# Patient Record
Sex: Female | Born: 1990 | Race: Asian | Hispanic: No | Marital: Married | Smoking: Never smoker
Health system: Southern US, Community
[De-identification: ages and names within clinical notes are randomized; demographics above are authoritative.]

## PROBLEM LIST (undated history)

## (undated) ENCOUNTER — Inpatient Hospital Stay (HOSPITAL_COMMUNITY): Payer: Self-pay

## (undated) DIAGNOSIS — A159 Respiratory tuberculosis unspecified: Secondary | ICD-10-CM

## (undated) HISTORY — DX: Respiratory tuberculosis unspecified: A15.9

---

## 2014-10-08 DIAGNOSIS — A159 Respiratory tuberculosis unspecified: Secondary | ICD-10-CM

## 2014-10-08 HISTORY — DX: Respiratory tuberculosis unspecified: A15.9

## 2016-11-13 ENCOUNTER — Other Ambulatory Visit: Payer: Self-pay | Admitting: Infectious Disease

## 2016-11-13 ENCOUNTER — Ambulatory Visit
Admission: RE | Admit: 2016-11-13 | Discharge: 2016-11-13 | Disposition: A | Discharge status: Home / Self Care | Payer: No Typology Code available for payment source | Source: Ambulatory Visit | Attending: Infectious Disease | Admitting: Infectious Disease

## 2016-11-13 DIAGNOSIS — R7611 Nonspecific reaction to tuberculin skin test without active tuberculosis: Secondary | ICD-10-CM

## 2018-01-15 ENCOUNTER — Ambulatory Visit (INDEPENDENT_AMBULATORY_CARE_PROVIDER_SITE_OTHER): Payer: Medicaid Other | Admitting: General Practice

## 2018-01-15 ENCOUNTER — Encounter: Payer: Self-pay | Admitting: General Practice

## 2018-01-15 DIAGNOSIS — Z3201 Encounter for pregnancy test, result positive: Secondary | ICD-10-CM | POA: Diagnosis present

## 2018-01-15 DIAGNOSIS — Z3492 Encounter for supervision of normal pregnancy, unspecified, second trimester: Secondary | ICD-10-CM

## 2018-01-15 LAB — POCT PREGNANCY, URINE: Preg Test, Ur: POSITIVE — AB

## 2018-01-15 NOTE — Progress Notes (Signed)
I have reviewed this chart and agree with the RN/CMA assessment and management.    Chivas Notz C Raynah Gomes, MD, FACOG Attending Physician, Faculty Practice [facility]  

## 2018-01-15 NOTE — Progress Notes (Signed)
Patient here for UPT today. UPT +. Patient reports first positive home test 2 months ago. LMP 09/09/17 EDD 06/16/18 7482w2d today. Patient denies taking any medications only PNV. Patient denies any history of health problems in pregnancy or outside of pregnancy. Patient was treated for TB in 2016. Patient desires to start care here. Scheduled ultrasound with MFM for 4/23 @ 8am. Will do new OB labs today. Patient had no questions. [employer] interpreter used for encounter.

## 2018-01-22 LAB — OBSTETRIC PANEL, INCLUDING HIV
Antibody Screen: NEGATIVE
BASOS ABS: 0 10*3/uL (ref 0.0–0.2)
Basos: 0 %
EOS (ABSOLUTE): 0.4 10*3/uL (ref 0.0–0.4)
Eos: 4 %
HEMOGLOBIN: 12 g/dL (ref 11.1–15.9)
HEP B S AG: NEGATIVE
HIV SCREEN 4TH GENERATION: NONREACTIVE
Hematocrit: 37.4 % (ref 34.0–46.6)
IMMATURE GRANS (ABS): 0.1 10*3/uL (ref 0.0–0.1)
Immature Granulocytes: 1 %
LYMPHS: 22 %
Lymphocytes Absolute: 2.6 10*3/uL (ref 0.7–3.1)
MCH: 24.9 pg — AB (ref 26.6–33.0)
MCHC: 32.1 g/dL (ref 31.5–35.7)
MCV: 78 fL — AB (ref 79–97)
MONOCYTES: 5 %
MONOS ABS: 0.6 10*3/uL (ref 0.1–0.9)
Neutrophils Absolute: 8.1 10*3/uL — ABNORMAL HIGH (ref 1.4–7.0)
Neutrophils: 68 %
Platelets: 234 10*3/uL (ref 150–379)
RBC: 4.82 x10E6/uL (ref 3.77–5.28)
RDW: 16.8 % — ABNORMAL HIGH (ref 12.3–15.4)
RPR Ser Ql: NONREACTIVE
RUBELLA: 31.9 {index} (ref >0.99)
Rh Factor: POSITIVE
WBC: 11.8 10*3/uL — ABNORMAL HIGH (ref 3.4–10.8)

## 2018-01-22 LAB — HEMOGLOBINOPATHY EVALUATION
Ferritin: 139 ng/mL (ref 15–150)
HGB A: 73.1 % — AB (ref 96.4–98.8)
HGB F QUANT: 0 % (ref 0.0–2.0)
HGB S: 0 %
HGB SOLUBILITY: NEGATIVE
Hgb A2 Quant: 3.5 % — ABNORMAL HIGH (ref 1.8–3.2)
Hgb C: 0 %
Hgb Variant: 23.4 % — ABNORMAL HIGH

## 2018-01-22 LAB — SMN1 COPY NUMBER ANALYSIS (SMA CARRIER SCREENING)

## 2018-01-22 LAB — CYSTIC FIBROSIS GENE TEST

## 2018-01-22 LAB — ALPHA-THALASSEMIA

## 2018-01-28 ENCOUNTER — Other Ambulatory Visit: Payer: Self-pay | Admitting: Obstetrics & Gynecology

## 2018-01-28 ENCOUNTER — Ambulatory Visit (HOSPITAL_COMMUNITY)
Admission: RE | Admit: 2018-01-28 | Discharge: 2018-01-28 | Disposition: A | Discharge status: Home / Self Care | Payer: Medicaid Other | Source: Ambulatory Visit | Attending: Obstetrics & Gynecology | Admitting: Obstetrics & Gynecology

## 2018-01-28 DIAGNOSIS — Z369 Encounter for antenatal screening, unspecified: Secondary | ICD-10-CM

## 2018-01-28 DIAGNOSIS — O0932 Supervision of pregnancy with insufficient antenatal care, second trimester: Secondary | ICD-10-CM | POA: Diagnosis not present

## 2018-01-28 DIAGNOSIS — Z3492 Encounter for supervision of normal pregnancy, unspecified, second trimester: Secondary | ICD-10-CM | POA: Diagnosis present

## 2018-01-28 DIAGNOSIS — Z3A20 20 weeks gestation of pregnancy: Secondary | ICD-10-CM | POA: Insufficient documentation

## 2018-01-29 ENCOUNTER — Encounter: Payer: Self-pay | Admitting: Obstetrics & Gynecology

## 2018-01-29 DIAGNOSIS — D563 Thalassemia minor: Secondary | ICD-10-CM | POA: Insufficient documentation

## 2018-02-05 ENCOUNTER — Other Ambulatory Visit (HOSPITAL_COMMUNITY)
Admission: RE | Admit: 2018-02-05 | Discharge: 2018-02-05 | Disposition: A | Discharge status: Home / Self Care | Payer: Medicaid Other | Source: Ambulatory Visit | Attending: Advanced Practice Midwife | Admitting: Advanced Practice Midwife

## 2018-02-05 ENCOUNTER — Other Ambulatory Visit: Payer: Self-pay

## 2018-02-05 ENCOUNTER — Encounter: Payer: Self-pay | Admitting: Advanced Practice Midwife

## 2018-02-05 ENCOUNTER — Ambulatory Visit (INDEPENDENT_AMBULATORY_CARE_PROVIDER_SITE_OTHER): Payer: Medicaid Other | Admitting: Advanced Practice Midwife

## 2018-02-05 ENCOUNTER — Encounter: Payer: Self-pay | Admitting: *Deleted

## 2018-02-05 DIAGNOSIS — Z3481 Encounter for supervision of other normal pregnancy, first trimester: Secondary | ICD-10-CM

## 2018-02-05 DIAGNOSIS — Z789 Other specified health status: Secondary | ICD-10-CM | POA: Insufficient documentation

## 2018-02-05 DIAGNOSIS — Z8611 Personal history of tuberculosis: Secondary | ICD-10-CM

## 2018-02-05 DIAGNOSIS — Z603 Acculturation difficulty: Secondary | ICD-10-CM

## 2018-02-05 DIAGNOSIS — Z3482 Encounter for supervision of other normal pregnancy, second trimester: Secondary | ICD-10-CM | POA: Diagnosis present

## 2018-02-05 DIAGNOSIS — Z3480 Encounter for supervision of other normal pregnancy, unspecified trimester: Secondary | ICD-10-CM | POA: Insufficient documentation

## 2018-02-05 DIAGNOSIS — Z98891 History of uterine scar from previous surgery: Secondary | ICD-10-CM

## 2018-02-05 LAB — POCT URINALYSIS DIP (DEVICE)
Bilirubin Urine: NEGATIVE
GLUCOSE, UA: NEGATIVE mg/dL
Hgb urine dipstick: NEGATIVE
Ketones, ur: NEGATIVE mg/dL
LEUKOCYTES UA: NEGATIVE
NITRITE: NEGATIVE
Protein, ur: NEGATIVE mg/dL
Specific Gravity, Urine: 1.015 (ref 1.005–1.030)
UROBILINOGEN UA: 0.2 mg/dL (ref 0.0–1.0)
pH: 7.5 (ref 5.0–8.0)

## 2018-02-05 NOTE — Progress Notes (Signed)
Subjective:   Jillian Browning is a 27 y.o. G1P0 at [redacted]w[redacted]d by LMP being seen today for her first obstetrical visit.  Her obstetrical history is significant for none . Prior c-section x 1 with first baby. She reports that the "baby did not go down".  Patient does not intend to breast feed. Pregnancy history fully reviewed.  Patient reports no complaints.  HISTORY: OB History  Gravida Para Term Preterm AB Living  0 0 1  SAB TAB Ectopic Multiple Live Births  0 0 0 0 1    # Outcome Date GA Lbr Len/2nd Weight Sex Delivery Anes PTL Lv  2 Current           1 Term 05/16/16 110w5d   F CS-Unspec   LIV     Complications: Failure to Progress in First Stage    Last pap smear was done Never and was NA  Past Medical History:  Diagnosis Date  . Tuberculosis 2016   Past Surgical History:  Procedure Laterality Date  . CESAREAN SECTION     History reviewed. No pertinent family history. Social History   Tobacco Use  . Smoking status: Never Smoker  . Smokeless tobacco: Never Used  Substance Use Topics  . Alcohol use: Never    Frequency: Never  . Drug use: Never   No Known Allergies Current Outpatient Medications on File Prior to Visit  Medication Sig Dispense Refill  . Prenatal Vit-Fe Fumarate-FA (PRENATAL MULTIVITAMIN) TABS tablet Take 1 tablet by mouth daily at 12 noon.     No current facility-administered medications on file prior to visit.     Review of Systems Pertinent items noted in HPI and remainder of comprehensive ROS otherwise negative.  Exam   Vitals:   02/05/18 1402  BP: 106/70  Pulse: 92  Weight: 137 lb 14.4 oz (62.6 kg)   Fetal Heart Rate (bpm): 160  Uterus:     Pelvic Exam: Perineum: no hemorrhoids, normal perineum   Vulva: normal external genitalia, no lesions   Vagina:  normal mucosa, normal discharge   Cervix: no lesions and normal, pap smear done.    Adnexa: normal adnexa and no mass, fullness, tenderness   Bony Pelvis: average  System: General:  well-developed, well-nourished female in no acute distress   Breast:  normal appearance, no masses or tenderness   Skin: normal coloration and turgor, no rashes   Neurologic: oriented, normal, negative, normal mood   Extremities: normal strength, tone, and muscle mass, ROM of all joints is normal   HEENT PERRLA, extraocular movement intact and sclera clear, anicteric   Mouth/Teeth mucous membranes moist, pharynx normal without lesions and dental hygiene good   Neck supple and no masses   Cardiovascular: regular rate and rhythm   Respiratory:  no respiratory distress, normal breath sounds   Abdomen: soft, non-tender; bowel sounds normal; no masses,  no organomegaly    Assessment:   Pregnancy: G1P0 Patient Active Problem List   Diagnosis Date Noted  . Supervision of other normal pregnancy, antepartum 02/05/2018  . Language barrier affecting health care 02/05/2018  . History of tuberculosis 02/05/2018  . Previous cesarean section 02/05/2018  . Alpha thalassemia silent carrier 01/29/2018     Plan:  1. Supervision of other normal pregnancy, antepartum 2. Language barrier affecting health care 3. History of tuberculosis 4. Prior c-section X 1 - Interested in TOLAC  - live interpretor used   Initial labs results reviewed. Continue prenatal vitamins. Genetic Screening discussed, AFP and  NIPS: requested. Ultrasound discussed; fetal anatomic survey: results reviewed. Problem list reviewed and updated. The nature of [facility] with multiple MDs and other Advanced Practice Providers was explained to patient; also emphasized that residents, students are part of our team. Routine obstetric precautions reviewed. Return in about 1 month (around 03/05/2018).

## 2018-02-05 NOTE — Patient Instructions (Signed)
AREA PEDIATRIC/FAMILY PRACTICE PHYSICIANS  [facility] [address], [address] [place], [place]  [address] Phone - [phone]   Fax - [phone]  [facility] [address] [place], [place] [address] Phone - [phone]   Fax - [phone]  [facility] [address] [place], [place]  [address] Phone - [phone]   Fax - [phone]  [facility] [address] [place], [place]  [address] Phone - [phone]   Fax - [phone]  [facility] [address] [place], [place]  [address] Phone - [phone]   Fax - [phone]  [facility] [address] [place], [place]  [address] Phone - [phone]   Fax - [phone]  [facility] [address] [place], [place]  [address] Phone - [phone]   Fax - [phone]  [facility] [address] [place], [place]  [address] Phone - [phone]   Fax - [phone]  [facility] [address] [place], [place]  [address] Phone - [phone]   Fax - [phone] [facility] [address] [place], [place]  [address] Phone - [phone]   Fax - [phone]  [facility] [address] [place], [place]  [address] Phone - [phone]   Fax - [phone]  [facility] [address] [place], [place]  [address] Phone - [phone]   Fax - [phone]  [facility] [address] [place], [place]  [address] Phone - [phone]   Fax - [phone]  [facility] GOSR[facility] [address] [place], [place]  [address] Phone - [phone]  [facility] [address] [place], [place]  [address] Phone - [phone]   Fax - [phone]  [facility] [address] [place], [place]  [address] Phone - [phone]   Fax - [phone]  [facility] [address] [place], [place]  [address] Phone - [phone]   Fax - [phone]  [facility] [address] [place], [place]  [address] Phone - [phone]   Fax - [phone]   [facility] [address] [place], [place]  [address] Phone - [phone]   Fax - [phone]  [facility] [address] [place], [place]  [address] Phone - [phone]   Fax - [phone]  [facility] RUB[facility] [address] [place], [place]  [address] Phone - [phone]   Fax - [phone]  [facility] [address] , [place]  [address] Phone - [phone]   Fax - [phone]  [facility] [address] , [place]  [address] Phone - [phone]   Fax - [phone] [facility] [address] [place], [place]  [address] Phone - [phone]   Fax - [phone]  [facility] [address] [place], [place]  [address] Phone - [phone]   Fax - [phone]   [facility] [address] [place], [place]  [address] Phone - [phone]   Fax - [phone]  [facility] [place] Charlene Flemming MD [address] [place]  [address] Phone [phone]  Fax [phone]   

## 2018-02-06 LAB — CYTOLOGY - PAP
CHLAMYDIA, DNA PROBE: NEGATIVE
Diagnosis: NEGATIVE
NEISSERIA GONORRHEA: NEGATIVE

## 2018-02-08 LAB — CULTURE, OB URINE

## 2018-02-08 LAB — URINE CULTURE, OB REFLEX: ORGANISM ID, BACTERIA: NO GROWTH

## 2018-02-13 LAB — AFP, SERUM, OPEN SPINA BIFIDA
AFP MOM: 0.64
AFP Value: 44 ng/mL
Gest. Age on Collection Date: 21.2 weeks
MATERNAL AGE AT EDD: 24.7 a
OSBR RISK 1 IN: 10000
TEST RESULTS AFP: NEGATIVE
Weight: 137 [lb_av]

## 2018-02-14 ENCOUNTER — Encounter: Payer: Self-pay | Admitting: *Deleted

## 2018-03-11 ENCOUNTER — Ambulatory Visit (INDEPENDENT_AMBULATORY_CARE_PROVIDER_SITE_OTHER): Payer: Medicaid Other | Admitting: Obstetrics and Gynecology

## 2018-03-11 VITALS — BP 121/75 | HR 104 | Wt 136.4 lb

## 2018-03-11 DIAGNOSIS — Z789 Other specified health status: Secondary | ICD-10-CM

## 2018-03-11 DIAGNOSIS — Z3480 Encounter for supervision of other normal pregnancy, unspecified trimester: Secondary | ICD-10-CM

## 2018-03-11 DIAGNOSIS — Z98891 History of uterine scar from previous surgery: Secondary | ICD-10-CM

## 2018-03-11 NOTE — Progress Notes (Signed)
Subjective:  Jillian Browning is a 27 y.o. G2P1001 at 658w1d being seen today for ongoing prenatal care.  She is currently monitored for the following issues for this low-risk pregnancy and has Alpha thalassemia silent carrier; Supervision of other normal pregnancy, antepartum; Language barrier affecting health care; History of tuberculosis; and Previous cesarean section on their problem list.  Patient reports no complaints.  Contractions: Not present. Vag. Bleeding: None.  Movement: Present. Denies leaking of fluid.   The following portions of the patient's history were reviewed and updated as appropriate: allergies, current medications, past family history, past medical history, past social history, past surgical history and problem list. Problem list updated.  Objective:   Vitals:   03/11/18 1535  BP: 121/75  Pulse: (!) 104  Weight: 136 lb 6.4 oz (61.9 kg)    Fetal Status: Fetal Heart Rate (bpm): 158 Fundal Height: 25 cm Movement: Present     General:  Alert, oriented and cooperative. Patient is in no acute distress.  Skin: Skin is warm and dry. No rash noted.   Cardiovascular: Normal heart rate noted  Respiratory: Normal respiratory effort, no problems with respiration noted  Abdomen: Soft, gravid, appropriate for gestational age. Pain/Pressure: Present     Pelvic: Vag. Bleeding: None     Cervical exam deferred        Extremities: Normal range of motion.  Edema: None  Mental Status: Normal mood and affect. Normal behavior. Normal judgment and thought content.   Urinalysis:      Assessment and Plan:  Pregnancy: G2P1001 at 598w1d  1. Supervision of other normal pregnancy, antepartum Doing well. Routine care. Plan for 28wk labs at next visit.   2. Previous cesarean section Unsure about route of delivery at this time. Discussed risk with both methods. Follow-up at next visit.   3. Language barrier affecting health care In-person interpretor used.   Preterm labor symptoms and general  obstetric precautions including but not limited to vaginal bleeding, contractions, leaking of fluid and fetal movement were reviewed in detail with the patient. Please refer to After Visit Summary for other counseling recommendations.  Return in about 3 weeks (around 04/01/2018) for ob visit.   Pincus LargePhelps, Ayahna Solazzo Y, DO

## 2018-03-11 NOTE — Patient Instructions (Signed)
Vaginal Birth After Cesarean Delivery Vaginal birth after cesarean delivery (VBAC) is giving birth vaginally after previously delivering a baby by a cesarean. In the past, if a woman had a cesarean delivery, all births afterward would be done by cesarean delivery. This is no longer true. It can be safe for the mother to try a vaginal delivery after having a cesarean delivery. It is important to discuss VBAC with your health care provider early in the pregnancy so you can understand the risks, benefits, and options. It will give you time to decide what is best in your particular case. The final decision about whether to have a VBAC or repeat cesarean delivery should be between you and your health care provider. Any changes in your health or your baby's health during your pregnancy may make it necessary to change your initial decision about VBAC. Women who plan to have a VBAC should check with their health care provider to be sure that:  The previous cesarean delivery was done with a low transverse uterine cut (incision) (not a vertical classical incision).  The birth canal is big enough for the baby.  There were no other operations on the uterus.  An electronic fetal monitor (EFM) will be on at all times during labor.  An operating room will be available and ready in case an emergency cesarean delivery is needed.  A health care provider and surgical nursing staff will be available at all times during labor to be ready to do an emergency delivery cesarean if necessary.  An anesthesiologist will be present in case an emergency cesarean delivery is needed.  The nursery is prepared and has adequate personnel and necessary equipment available to care for the baby in case of an emergency cesarean delivery. Benefits of VBAC  Shorter stay in the hospital.  Avoidance of risks associated with cesarean delivery, such as: ? Surgical complications, such as opening of the incision or hernia in the  incision. ? Injury to other organs. ? Fever. This can occur if an infection develops after surgery. It can also occur as a reaction to the medicine given to make you numb during the surgery.  Less blood loss and need for blood transfusions.  Lower risk of blood clots and infection.  Shorter recovery.  Decreased risk for having to remove the uterus (hysterectomy).  Decreased risk for the placenta to completely or partially cover the opening of the uterus (placenta previa) with a future pregnancy.  Decrease risk in future labor and delivery. Risks of a VBAC  Tearing (rupture) of the uterus. This is occurs in less than 1% of VBACs. The risk of this happening is higher if: ? Steps are taken to begin the labor process (induce labor) or stimulate or strengthen contractions (augment labor). ? Medicine is used to soften (ripen) the cervix.  Having to remove the uterus (hysterectomy) if it ruptures. VBAC should not be done if:  The previous cesarean delivery was done with a vertical (classical) or T-shaped incision or you do not know what kind of incision was made.  You had a ruptured uterus.  You have had certain types of surgery on your uterus, such as removal of uterine fibroids. Ask your health care provider about other types of surgeries that prevent you from having a VBAC.  You have certain medical or childbirth (obstetrical) problems.  There are problems with the baby.  You have had two previous cesarean deliveries and no vaginal deliveries. Other facts to know about VBAC:  It   is safe to have an epidural anesthetic with VBAC.  It is safe to turn the baby from a breech position (attempt an external cephalic version).  It is safe to try a VBAC with twins.  VBAC may not be successful if your baby weights 8.8 lb (4 kg) or more. However, weight predictions are not always accurate and should not be used alone to decide if VBAC is right for you.  There is an increased failure rate  if the time between the cesarean delivery and VBAC is less than 19 months.  Your health care provider may advise against a VBAC if you have preeclampsia (high blood pressure, protein in the urine, and swelling of face and extremities).  VBAC is often successful if you previously gave birth vaginally.  VBAC is often successful when the labor starts spontaneously before the due date.  Delivering a baby through a VBAC is similar to having a normal spontaneous vaginal delivery. This information is not intended to replace advice given to you by your health care provider. Make sure you discuss any questions you have with your health care provider. Document Released: 03/17/2007 Document Revised: 03/01/2016 Document Reviewed: 04/23/2013 Elsevier Interactive Patient Education  2018 Elsevier Inc.  

## 2018-03-11 NOTE — Progress Notes (Signed)
Interpreter Wier Siu 

## 2018-03-12 ENCOUNTER — Encounter: Payer: Self-pay | Admitting: Family Medicine

## 2018-03-12 DIAGNOSIS — O0930 Supervision of pregnancy with insufficient antenatal care, unspecified trimester: Secondary | ICD-10-CM | POA: Insufficient documentation

## 2018-03-28 ENCOUNTER — Other Ambulatory Visit: Payer: Self-pay | Admitting: *Deleted

## 2018-03-28 DIAGNOSIS — Z3480 Encounter for supervision of other normal pregnancy, unspecified trimester: Secondary | ICD-10-CM

## 2018-03-28 DIAGNOSIS — Z98891 History of uterine scar from previous surgery: Secondary | ICD-10-CM

## 2018-04-01 ENCOUNTER — Ambulatory Visit (INDEPENDENT_AMBULATORY_CARE_PROVIDER_SITE_OTHER): Payer: Medicaid Other | Admitting: Obstetrics and Gynecology

## 2018-04-01 ENCOUNTER — Other Ambulatory Visit: Payer: Medicaid Other

## 2018-04-01 VITALS — BP 115/68 | HR 85 | Ht 63.0 in | Wt 157.9 lb

## 2018-04-01 DIAGNOSIS — Z98891 History of uterine scar from previous surgery: Secondary | ICD-10-CM | POA: Diagnosis not present

## 2018-04-01 DIAGNOSIS — Z789 Other specified health status: Secondary | ICD-10-CM

## 2018-04-01 DIAGNOSIS — Z3480 Encounter for supervision of other normal pregnancy, unspecified trimester: Secondary | ICD-10-CM

## 2018-04-01 DIAGNOSIS — Z23 Encounter for immunization: Secondary | ICD-10-CM | POA: Diagnosis not present

## 2018-04-01 NOTE — Progress Notes (Signed)
   PRENATAL VISIT NOTE  Subjective:  Jillian Browning is a 27 y.o. G2P1001 at 2121w1d being seen today for ongoing prenatal care.  She is currently monitored for the following issues for this low-risk pregnancy and has Alpha thalassemia silent carrier; Supervision of other normal pregnancy, antepartum; Language barrier affecting health care; History of tuberculosis; Previous cesarean section; and Late prenatal care affecting pregnancy on their problem list.  Patient reports nausea and vomiting, but does not request any meds at this time.  Contractions: Not present. Vag. Bleeding: None.  Movement: Present. Denies leaking of fluid.   The following portions of the patient's history were reviewed and updated as appropriate: allergies, current medications, past family history, past medical history, past social history, past surgical history and problem list. Problem list updated.  Objective:   Vitals:   04/01/18 0846 04/01/18 0851  BP: 115/68   Pulse: 85   Weight: 157 lb 14.4 oz (71.6 kg)   Height:  5\' 3"  (1.6 m)    Fetal Status: Fetal Heart Rate (bpm): 152 Fundal Height: 30 cm Movement: Present     General:  Alert, oriented and cooperative. Patient is in no acute distress.  Skin: Skin is warm and dry. No rash noted.   Cardiovascular: Normal heart rate noted  Respiratory: Normal respiratory effort, no problems with respiration noted  Abdomen: Soft, gravid, appropriate for gestational age.  Pain/Pressure: Present     Pelvic: Cervical exam deferred        Extremities: Normal range of motion.  Edema: None  Mental Status: Normal mood and affect. Normal behavior. Normal judgment and thought content.   Assessment and Plan:  Pregnancy: G2P1001 at 6121w1d  1. Supervision of other normal pregnancy, antepartum - Third trimester pregnancy instructions given - Anticipatory guidance for OB visits every 2 wks until 36 wks then weekly - Discussed contraception options with patient and spouse; declines any  contraception after delivery at this time - TdaP given today  2. Previous cesarean section - Desire TOLAC (previous was for suspected failure to descend) - R/B reviewed with patient - Consent not signed at this visit - will need to be signed at next visit  3. Language barrier affecting health care - [employer] video interpreter Tanya # [phone]  Preterm labor symptoms and general obstetric precautions including but not limited to vaginal bleeding, contractions, leaking of fluid and fetal movement were reviewed in detail with the patient. Please refer to After Visit Summary for other counseling recommendations.  Return in about 2 weeks (around 04/15/2018) for Return OB visit.  Future Appointments  Date Time Provider Department Center  04/01/2018  9:30 AM [facility]    Rough [place] ReadyRolitta Pretty [place], [place]

## 2018-04-01 NOTE — Progress Notes (Signed)
   PRENATAL VISIT NOTE  Subjective:  Jillian Browning is a 27 y.o. G2P1001 at 6462w1d being seen today for ongoing prenatal care.  She is currently monitored for the following issues for this low-risk pregnancy and has Alpha thalassemia silent carrier; Supervision of other normal pregnancy, antepartum; Language barrier affecting health care; History of tuberculosis; Previous cesarean section; and Late prenatal care affecting pregnancy on their problem list.  Patient reports no complaints with occasional N&V. She states that she does not need any intervention for sx.  Contractions: Not present. Vag. Bleeding: None.  Movement: Present. Denies leaking of fluid.   The following portions of the patient's history were reviewed and updated as appropriate: allergies, current medications, past family history, past medical history, past social history, past surgical history and problem list. Problem list updated.  Objective:   Vitals:   04/01/18 0846 04/01/18 0851  BP: 115/68   Pulse: 85   Weight: 157 lb 14.4 oz (71.6 kg)   Height:  5\' 3"  (1.6 m)    Fetal Status: Fetal Heart Rate (bpm): 152 Fundal Height: 30 cm Movement: Present     General:  Alert, oriented and cooperative. Patient is in no acute distress.  Skin: Skin is warm and dry. No rash noted.   Cardiovascular: Normal heart rate noted  Respiratory: Normal respiratory effort, no problems with respiration noted  Abdomen: Soft, gravid, appropriate for gestational age.  Pain/Pressure: Present     Pelvic: Cervical exam deferred        Extremities: Normal range of motion.  Edema: None  Mental Status: Normal mood and affect. Normal behavior. Normal judgment and thought content.   Assessment and Plan:  Pregnancy: G2P1001 at 3062w1d  1. Supervision of other normal pregnancy, antepartum Pt. counseled on S&S of pre term labor Pt. Was informed of various methods of birth control. Pt. Has verbalized no interests in post-partum contraception.  2. Previous  cesarean section Pt. Has verbalized interest in TOLAC. Pt. still has to sign consent  3. Language barrier affecting health care Kenney Housemananya [phone]; interpreter  Preterm labor symptoms and general obstetric precautions including but not limited to vaginal bleeding, contractions, leaking of fluid and fetal movement were reviewed in detail with the patient. Please refer to After Visit Summary for other counseling recommendations.  Return in about 2 weeks (around 04/15/2018) for Return OB visit.  No future appointments.  Fahim Kats Winona LegatoM Dempsey Knotek, RN, FNP (student)

## 2018-04-01 NOTE — Progress Notes (Incomplete)
   PRENATAL VISIT NOTE  Subjective:  Jillian Browning is a 27 y.o. G2P1001 at 172w1d being seen today for ongoing prenatal care.  She is currently monitored for the following issues for this {Blank single:19197::"high-risk","low-risk"} pregnancy and has Alpha thalassemia silent carrier; Supervision of other normal pregnancy, antepartum; Language barrier affecting health care; History of tuberculosis; Previous cesarean section; and Late prenatal care affecting pregnancy on their problem list.  Patient reports {sx:14538}.  Contractions: Not present. Vag. Bleeding: None.  Movement: Present. Denies leaking of fluid.   The following portions of the patient's history were reviewed and updated as appropriate: allergies, current medications, past family history, past medical history, past social history, past surgical history and problem list. Problem list updated.  Objective:   Vitals:   04/01/18 0846 04/01/18 0851  BP: 115/68   Pulse: 85   Weight: 157 lb 14.4 oz (71.6 kg)   Height:  5\' 3"  (1.6 m)    Fetal Status: Fetal Heart Rate (bpm): 152 Fundal Height: 30 cm Movement: Present     General:  Alert, oriented and cooperative. Patient is in no acute distress.  Skin: Skin is warm and dry. No rash noted.   Cardiovascular: Normal heart rate noted  Respiratory: Normal respiratory effort, no problems with respiration noted  Abdomen: Soft, gravid, appropriate for gestational age.  Pain/Pressure: Present     Pelvic: {Blank single:19197::"Cervical exam performed","Cervical exam deferred"}        Extremities: Normal range of motion.  Edema: None  Mental Status: Normal mood and affect. Normal behavior. Normal judgment and thought content.   Assessment and Plan:  Pregnancy: G2P1001 at 7172w1d  1. Supervision of other normal pregnancy, antepartum ***  2. Previous cesarean section ***  3. Language barrier affecting health care ***  {Blank single:19197::"Term","Preterm"} labor symptoms and general obstetric  precautions including but not limited to vaginal bleeding, contractions, leaking of fluid and fetal movement were reviewed in detail with the patient. Please refer to After Visit Summary for other counseling recommendations.  Return in about 2 weeks (around 04/15/2018) for Return OB visit.  Future Appointments  Date Time Provider Department Center  04/01/2018  9:30 AM [facility]    [place]erry[place]itta Alanii Ramer, [place]

## 2018-04-01 NOTE — Patient Instructions (Signed)
Ba thng th? th? ba c?a thai k? (Third Trimester of Pregnancy) Ba thng th? ba l t? tu?n 29 ??n tu?n 42, thng th? 7 ??n thng th? 9. Ba thng th? ba c?ng l th?i gian khi bo thai ?ang pht tri?n nhanh chng. Vo cu?i thng th? chn, bo thai di kho?ng 20 inch v n?ng kho?ng 6-10 pao. C? TH? THAY ??I C? th? c?a qu v? tr?i qua r?t nhi?u thay ??i trong th?i gian mang thai. Nh?ng thay ??i khc nhau ? cc ph? n? khc nhau.  Cn n?ng c?a qu v? s? ti?p t?c t?ng. Qu v? c th? t?ng ???c 25-35 pao (11 - 16 kg) vo cu?i thai k?.  Qu v? c th? b?t ??u c cc v?t n?t trn hng, b?ng v ng?c.  Qu v? c th? ?i ti?u th??ng xuyn h?n v bo thai di chuy?n xu?ng th?p h?n vo khung ch?u c?a qu v? v ? vo bng quang c?a qu v?.  Qu v? c th? b? ho?c ti?p t?c b? ? nng do vi?c mang thai.  Qu v? c th? b? to bn v m?t s? hoc mn nh?t ??nh ?ang lm cc c? c ch?c n?ng ??y ch?t th?i qua ???ng ru?t ho?t ??ng ch?m l?i.  Qu v? c th? b? b?nh tr? ho?c s?ng, ph?ng t?nh m?ch (gin t?nh m?ch).  Qu v? c th? ?au khung ch?u v t?ng cn v cc hoc mn mang thai lm l?ng kh?p n?i gi?a cc x??ng ? khung ch?u c?a qu v?. ?au l?ng c th? do s? c? g?ng qu s?c c?a c? b?p h? tr? t? th? c?a qu v?.  Qu v? c nh?ng thay ??i v? tc. Nh?ng thay ??i ny c th? bao g?m tc dy ln, tc m?c nhanh h?n v thay ??i v? k?t c?u. M?t s? ph? n? c?ng b? r?ng tc trong khi ho?c sau khi mang thai, ho?c tc c?m th?y kh ho?c m?ng. Tc c?a qu v? s? c nhi?u kh? n?ng s? tr? l?i bnh th??ng sau khi em b ???c sinh ra.  Ng?c qu v? s? ti?p t?c pht tri?n v nh?y c?m ?au. Ch?t d?ch mu vng c th? r? t? v c?a qu v? g?i l s?a non.  R?n c?a qu v? c th? l?i ra.  Qu v? c th? c?m th?y kh th? v t? cung c?a qu v? to ra.  Qu v? c th? nh?n th?y thai nhi "r?i xu?ng", ho?c di chuy?n xu?ng th?p h?n trong b?ng c?a qu v?.  Qu v? c th? b? ti?t d?ch nh?y c mu. ?i?u ny th??ng x?y ra m?t vi ngy ??n m?t tu?n tr??c khi b?t ??u  sinh ??.  C? t? cung c?a qu v? tr? nn m?ng v m?m (b? m?ng c? t? cung) g?n ngy d? sinh c?a qu v?. TRNG ??I ?I?U G TRONG NH?NG L?N KHM TR??C KHI SINH Qu v? s? ???c khm tr??c khi sinh 2 tu?n m?t l?n cho ??n tu?n 36. Sau ?, qu v? s? ???c khm hng tu?n tr??c khi sinh. Trong m?t l?n khm tr??c khi sinh:  Qu v? s? ???c cn n?ng ?? ??m b?o qu v? v thai nhi ?ang pht tri?n bnh th??ng.  Qu v? ???c ?o huy?t p.  Qu v? s? ???c ?o b?ng ?? theo di s? pht tri?n c?a b.  S? nghe th?y nh?p tim thai.  B?t k? k?t qu? ki?m tra no trong l?n khm tr??c ? s? ???c th?o lu?n.  Qu v? c   th? ???c ki?m tra c? t? cung g?n ngy d? sinh ?? xem c? t? cung ? m?ng ch?a. Lc ???c kho?ng 36 tu?n, chuyn gia ch?m sc y t? c?a qu v? s? ki?m tra c? t? cung c?a qu v?. Cng lc ?, chuyn gia ch?m sc y t? c?ng s? th?c hi?n ki?m tra ch?t ti?t ra c?a m m ??o. Ki?m tra ny l ?? xc ??nh xem c m?t lo?i vi khu?n, lin c?u nhm B, hay khng. Chuyn gia ch?m sc y t? c?a qu v? s? gi?i thch thm. Chuyn gia ch?m sc y t? c th? h?i qu v?:  K? ho?ch sinh c?a qu v? l g.  Qu v? c?m th?y th? no.  Qu v? c c?m th?y em b c? ??ng.  Li?u qu v? c b?t k? tri?u ch?ng b?t th??ng no, ch?ng h?n nh? r r? ch?t l?ng, ch?y mu, ?au ??u nghim tr?ng ho?c co th?t ? b?ng.  Li?u qu v? c ?ang s? d?ng b?t k? s?n ph?m thu?c l no, bao g?m thu?c l d?ng ht, thu?c l d?ng nhai v thu?c l ?i?n t?.  Li?u qu v? c b?t k? cu h?i no khng. Nh?ng ki?m tra ho?c sng l?c khc c th? ???c th?c hi?n trong k? ba thng th? hai c?a qu v? bao g?m:  Cc xt nghi?m mu ki?m tra n?ng ?? s?t th?p (thi?u mu).  Th? thai ?? ki?m tra s?c kh?e, m?c ?? ho?t ??ng v s? pht tri?n c?a thai nhi. Th? nghi?m ???c th?c hi?n n?u qu v? c m?t s? tnh tr?ng b?nh l, ho?c n?u c nh?ng v?n ?? trong qu trnh mang thai.  Xt nghi?m HIV (Vi rt suy gi?m mi?n d?ch ? ng??i). N?u qu v? c nguy c? cao, qu v? c th? ???c ki?m tra HIV  trong ba thng th? ba c?a k? mang thai. CHUY?N D? GI? Qu v? c th? c?m th?y co th?t nh?, khng ??u m d?n d?n s? h?t. Chng ???c g?i l c?n co th?t Braxton Hicks, hay chuy?n d? gi?. Co th?t c th? ko di trong nhi?u gi?, nhi?u ngy, ho?c th?m ch nhi?u tu?n tr??c khi chuy?n d? th?t. N?u c?n co th?t ??u ??n, t?ng c??ng, ho?c tr? nn ?au ??n, t?t nh?t l ph?i ??n g?p chuyn gia ch?m sc y t? ?? khm. CC D?U HI?U TR? D?  Co th?t gi?ng nh? khi c kinh nguy?t.  Cc c?n co th?t cch nhau 5 pht ho?c nhanh h?n.  Co th?t b?t ??u t? ??nh t? cung v lan xu?ng b?ng d??i v l?ng.  C?m gic p l?c ln khung ch?u t?ng ln ho?c ?au l?ng.  Ti?t d?ch nh?y c n??c ho?c mu t? m ??o. N?u qu v? c b?t k? nh?ng d?u hi?u ny tr??c tu?n 37 c?a thai k?, g?i cho chuyn gia ch?m sc y t? ngay l?p t?c. Qu v? s? c?n ??n b?nh vi?n ?? ???c ki?m tra ngay l?p t?c. H??NG D?N CH?M SC T?I NH  Trnh t?t c? thu?c l, th?o d??c, r??u v ma ty khng ???c k ??n. Cc ha ch?t ny ?nh h??ng ??n s? hnh thnh v pht tri?n c?a em b.  Khng s? d?ng b?t c? cc s?n ph?m thu?c l no, bao g?m thu?c l d?ng ht, thu?c l d?ng nhai v thu?c l ?i?n t?. N?u qu v? c?n gip ?? ?? cai thu?c, hy h?i chuyn gia ch?m sc s?c kh?e. Qu v? c th? nh?n ???c h? tr? t?   v?n v cc ngu?n h? tr? khc ?? gip qu v? b? thu?c l.  Tun th? cc ch? d?n c?a chuyn gia ch?m sc y t? v? vi?c s? d?ng thu?c. C nh?ng lo?i thu?c l an ton ho?c khng an ton trong qu trnh mang thai.  T?p th? d?c th??ng xuyn theo ch? d?n c?a chuyn gia ch?m sc y t?. B? co th?t t? cung l m?t d?u hi?u t?t ?? ng?ng t?p th? d?c.  Ti?p t?c ?n cc b?a ?n th??ng xuyn, lnh m?nh.  M?c m?t chi?c o ng?c nng ?? t?t cho v b? nh?y c?m ?au.  Khng s? d?ng b?n t?m n??c nng, phng xng h?i, ho?c phng t?m h?i.  ?eo dy an ton c?a qu v? m?i lc khi li xe.  Trnh th?t s?ng, ph mai ch?a n?u chn, h?p v? sinh c?a mo, v ??t v? sinh dnh cho mo. Nh?ng th? ny mang  m?m b?nh c th? gy d? t?t b?m sinh cho em b.  U?ng vitamin tr??c khi sinh c?a qu v?.  U?ng 1500-2000 mg canxi m?i ngy b?t ??u t? tu?n th? 20 c?a thai k? cho ??n khi sinh con.  Hy th? dng thu?c lm m?m phn (n?u chuyn gia ch?m sc y t? c?a qu v? ch?p thu?n) n?u qu v? b? to bn. ?n thm th?c ?n giu ch?t x?, nh? rau t??i ho?c tri cy v ng? c?c nguyn h?t. U?ng nhi?u n??c ?? gi? cho n??c ti?u trong ho?c vng nh?t.  T?m b?n ng?i ?? lm d?u c?n ?au ho?c s? kh ch?u do b?nh tr? gy ra. S? d?ng kem tr? n?u chuyn gia ch?m sc y t? ch?p thu?n.  N?u qu v? b? gin t?nh m?ch, hy ?eo t?t h? tr?. Nng cao chn c?a qu v? trong 15 pht, 3-4 l?n m?t ngy. H?n ch? mu?i trong ch? ?? ?n c?a qu v?.  Trnh nng v?t n?ng, hay ?i giy th?p gt, v luy?n t?p t? th? ph h?p.  Ngh? ng?i nhi?u v?i ?i chn c?a qu v? nng cao n?u qu v? b? chu?t rt chn ho?c ?au vng th?t l?ng.  ??n khm nha s? n?u qu v? ch?a ??n trong th?i gian qu v? mang thai. S? d?ng m?t bn ch?i m?m ?? ch?i r?ng c?a qu v? v hy nh? nhng khi dng ch? nha khoa.  C th? ???c ti?p t?c c quan h? tnh d?c tr? khi chuyn gia ch?m sc y t? yu c?u khc.  Khng ?i du l?ch xa tr? khi n l hon ton c?n thi?t v ch? ?i v?i s? ch?p thu?n c?a chuyn gia ch?m sc y t?.  Tham gia cc l?p h?c tr??c khi sinh ?? hi?u, th?c hnh, v ??t cu h?i v? tr? d? v sinh con.  Th?c hi?n m?t l?n th? t?i b?nh vi?n.  Chu?n b? ti ?? ?? ?i vi?n.  Chu?n b? phng cho em b.  Ti?p t?c ??n m?i l?n h?n khm tr??c khi sinh theo ch? d?n c?a chuyn gia ch?m sc y t? c?a qu v?.  ?I KHM N?U:  Qu v? khng ch?c ch?n li?u qu v? c tr? d? ho?c li?u qu v? ? v? ?i hay ch?a.  Qu v? b? hoa m?t.  Qu v? b? co th?t vng ch?u, p l?c ln vng ch?u, ?au m ? ? vng b?ng c?a qu v?.  Qu v? b? bu?n nn, nn m?a ho?c tiu ch?y lin t?c.  Qu v? c d?ch m ??o mi kh ch?u.    Qu v? b? ?au khi ?i ti?u.  NGAY L?P T?C ?I KHM N?U:  Qu v? b? s?t.  Qu  v? b? r r? d?ch t? m ??o.  Qu v? c ra ??m mu ho?c ch?y mu t? m ??o.  Qu v? b? co th?t nghim tr?ng ho?c ?au ? b?ng.  Qu v? gi?m cn ho?c t?ng cn nhanh.  Qu v? b? kh th? cng v?i ?au ng?c.  Qu v? th?y s?ng b?t ng? ho?c r?t to ? m?t, tay, m?t c chn, bn chn ho?c c?ng chn.  Qu v? khng th?y em b c? ??ng trong h?n m?t gi?.  Qu v? b? ?au ??u nghim tr?ng m khng h?t sau khi dng thu?c.  Qu v? b? thay ??i th? l?c.  Thng tin ny khng nh?m m?c ?ch thay th? cho l?i khuyn m chuyn gia ch?m sc s?c kh?e ni v?i qu v?. Hy b?o ??m qu v? ph?i th?o lu?n b?t k? v?n ?? g m qu v? c v?i chuyn gia ch?m sc s?c kh?e c?a qu v?. Document Released: 10/21/2015 Document Revised: 10/21/2015 Document Reviewed: 11/25/2012 Elsevier Interactive Patient Education  2017 Elsevier Inc.  

## 2018-04-02 LAB — CBC
Hematocrit: 36.3 % (ref 34.0–46.6)
Hemoglobin: 12 g/dL (ref 11.1–15.9)
MCH: 26.4 pg — ABNORMAL LOW (ref 26.6–33.0)
MCHC: 33.1 g/dL (ref 31.5–35.7)
MCV: 80 fL (ref 79–97)
Platelets: 196 10*3/uL (ref 150–450)
RBC: 4.54 x10E6/uL (ref 3.77–5.28)
RDW: 15.3 % (ref 12.3–15.4)
WBC: 12.9 10*3/uL — ABNORMAL HIGH (ref 3.4–10.8)

## 2018-04-02 LAB — RPR: RPR Ser Ql: NONREACTIVE

## 2018-04-02 LAB — GLUCOSE TOLERANCE, 2 HOURS W/ 1HR
Glucose, 1 hour: 94 mg/dL (ref 65–179)
Glucose, 2 hour: 104 mg/dL (ref 65–152)
Glucose, Fasting: 86 mg/dL (ref 65–91)

## 2018-04-02 LAB — HIV ANTIBODY (ROUTINE TESTING W REFLEX): HIV SCREEN 4TH GENERATION: NONREACTIVE

## 2018-04-15 ENCOUNTER — Ambulatory Visit (INDEPENDENT_AMBULATORY_CARE_PROVIDER_SITE_OTHER): Payer: Medicaid Other | Admitting: Advanced Practice Midwife

## 2018-04-15 VITALS — BP 104/67 | HR 99 | Wt 159.0 lb

## 2018-04-15 DIAGNOSIS — Z3480 Encounter for supervision of other normal pregnancy, unspecified trimester: Secondary | ICD-10-CM

## 2018-04-15 DIAGNOSIS — Z789 Other specified health status: Secondary | ICD-10-CM

## 2018-04-15 DIAGNOSIS — Z3483 Encounter for supervision of other normal pregnancy, third trimester: Secondary | ICD-10-CM

## 2018-04-15 DIAGNOSIS — Z98891 History of uterine scar from previous surgery: Secondary | ICD-10-CM

## 2018-04-15 NOTE — Patient Instructions (Signed)
Ba thng th? th? ba c?a thai k? (Third Trimester of Pregnancy) Ba thng th? ba l t? tu?n 29 ??n tu?n 42, thng th? 7 ??n thng th? 9. Ba thng th? ba c?ng l th?i gian khi bo thai ?ang pht tri?n nhanh chng. Vo cu?i thng th? chn, bo thai di kho?ng 20 inch v n?ng kho?ng 6-10 pao. C? TH? THAY ??I C? th? c?a qu v? tr?i qua r?t nhi?u thay ??i trong th?i gian mang thai. Nh?ng thay ??i khc nhau ? cc ph? n? khc nhau.  Cn n?ng c?a qu v? s? ti?p t?c t?ng. Qu v? c th? t?ng ???c 25-35 pao (11 - 16 kg) vo cu?i thai k?.  Qu v? c th? b?t ??u c cc v?t n?t trn hng, b?ng v ng?c.  Qu v? c th? ?i ti?u th??ng xuyn h?n v bo thai di chuy?n xu?ng th?p h?n vo khung ch?u c?a qu v? v ? vo bng quang c?a qu v?.  Qu v? c th? b? ho?c ti?p t?c b? ? nng do vi?c mang thai.  Qu v? c th? b? to bn v m?t s? hoc mn nh?t ??nh ?ang lm cc c? c ch?c n?ng ??y ch?t th?i qua ???ng ru?t ho?t ??ng ch?m l?i.  Qu v? c th? b? b?nh tr? ho?c s?ng, ph?ng t?nh m?ch (gin t?nh m?ch).  Qu v? c th? ?au khung ch?u v t?ng cn v cc hoc mn mang thai lm l?ng kh?p n?i gi?a cc x??ng ? khung ch?u c?a qu v?. ?au l?ng c th? do s? c? g?ng qu s?c c?a c? b?p h? tr? t? th? c?a qu v?.  Qu v? c nh?ng thay ??i v? tc. Nh?ng thay ??i ny c th? bao g?m tc dy ln, tc m?c nhanh h?n v thay ??i v? k?t c?u. M?t s? ph? n? c?ng b? r?ng tc trong khi ho?c sau khi mang thai, ho?c tc c?m th?y kh ho?c m?ng. Tc c?a qu v? s? c nhi?u kh? n?ng s? tr? l?i bnh th??ng sau khi em b ???c sinh ra.  Ng?c qu v? s? ti?p t?c pht tri?n v nh?y c?m ?au. Ch?t d?ch mu vng c th? r? t? v c?a qu v? g?i l s?a non.  R?n c?a qu v? c th? l?i ra.  Qu v? c th? c?m th?y kh th? v t? cung c?a qu v? to ra.  Qu v? c th? nh?n th?y thai nhi "r?i xu?ng", ho?c di chuy?n xu?ng th?p h?n trong b?ng c?a qu v?.  Qu v? c th? b? ti?t d?ch nh?y c mu. ?i?u ny th??ng x?y ra m?t vi ngy ??n m?t tu?n tr??c khi b?t ??u  sinh ??.  C? t? cung c?a qu v? tr? nn m?ng v m?m (b? m?ng c? t? cung) g?n ngy d? sinh c?a qu v?. TRNG ??I ?I?U G TRONG NH?NG L?N KHM TR??C KHI SINH Qu v? s? ???c khm tr??c khi sinh 2 tu?n m?t l?n cho ??n tu?n 36. Sau ?, qu v? s? ???c khm hng tu?n tr??c khi sinh. Trong m?t l?n khm tr??c khi sinh:  Qu v? s? ???c cn n?ng ?? ??m b?o qu v? v thai nhi ?ang pht tri?n bnh th??ng.  Qu v? ???c ?o huy?t p.  Qu v? s? ???c ?o b?ng ?? theo di s? pht tri?n c?a b.  S? nghe th?y nh?p tim thai.  B?t k? k?t qu? ki?m tra no trong l?n khm tr??c ? s? ???c th?o lu?n.  Qu v? c   th? ???c ki?m tra c? t? cung g?n ngy d? sinh ?? xem c? t? cung ? m?ng ch?a. Lc ???c kho?ng 36 tu?n, chuyn gia ch?m sc y t? c?a qu v? s? ki?m tra c? t? cung c?a qu v?. Cng lc ?, chuyn gia ch?m sc y t? c?ng s? th?c hi?n ki?m tra ch?t ti?t ra c?a m m ??o. Ki?m tra ny l ?? xc ??nh xem c m?t lo?i vi khu?n, lin c?u nhm B, hay khng. Chuyn gia ch?m sc y t? c?a qu v? s? gi?i thch thm. Chuyn gia ch?m sc y t? c th? h?i qu v?:  K? ho?ch sinh c?a qu v? l g.  Qu v? c?m th?y th? no.  Qu v? c c?m th?y em b c? ??ng.  Li?u qu v? c b?t k? tri?u ch?ng b?t th??ng no, ch?ng h?n nh? r r? ch?t l?ng, ch?y mu, ?au ??u nghim tr?ng ho?c co th?t ? b?ng.  Li?u qu v? c ?ang s? d?ng b?t k? s?n ph?m thu?c l no, bao g?m thu?c l d?ng ht, thu?c l d?ng nhai v thu?c l ?i?n t?.  Li?u qu v? c b?t k? cu h?i no khng. Nh?ng ki?m tra ho?c sng l?c khc c th? ???c th?c hi?n trong k? ba thng th? hai c?a qu v? bao g?m:  Cc xt nghi?m mu ki?m tra n?ng ?? s?t th?p (thi?u mu).  Th? thai ?? ki?m tra s?c kh?e, m?c ?? ho?t ??ng v s? pht tri?n c?a thai nhi. Th? nghi?m ???c th?c hi?n n?u qu v? c m?t s? tnh tr?ng b?nh l, ho?c n?u c nh?ng v?n ?? trong qu trnh mang thai.  Xt nghi?m HIV (Vi rt suy gi?m mi?n d?ch ? ng??i). N?u qu v? c nguy c? cao, qu v? c th? ???c ki?m tra HIV  trong ba thng th? ba c?a k? mang thai. CHUY?N D? GI? Qu v? c th? c?m th?y co th?t nh?, khng ??u m d?n d?n s? h?t. Chng ???c g?i l c?n co th?t Braxton Hicks, hay chuy?n d? gi?. Co th?t c th? ko di trong nhi?u gi?, nhi?u ngy, ho?c th?m ch nhi?u tu?n tr??c khi chuy?n d? th?t. N?u c?n co th?t ??u ??n, t?ng c??ng, ho?c tr? nn ?au ??n, t?t nh?t l ph?i ??n g?p chuyn gia ch?m sc y t? ?? khm. CC D?U HI?U TR? D?  Co th?t gi?ng nh? khi c kinh nguy?t.  Cc c?n co th?t cch nhau 5 pht ho?c nhanh h?n.  Co th?t b?t ??u t? ??nh t? cung v lan xu?ng b?ng d??i v l?ng.  C?m gic p l?c ln khung ch?u t?ng ln ho?c ?au l?ng.  Ti?t d?ch nh?y c n??c ho?c mu t? m ??o. N?u qu v? c b?t k? nh?ng d?u hi?u ny tr??c tu?n 37 c?a thai k?, g?i cho chuyn gia ch?m sc y t? ngay l?p t?c. Qu v? s? c?n ??n b?nh vi?n ?? ???c ki?m tra ngay l?p t?c. H??NG D?N CH?M SC T?I NH  Trnh t?t c? thu?c l, th?o d??c, r??u v ma ty khng ???c k ??n. Cc ha ch?t ny ?nh h??ng ??n s? hnh thnh v pht tri?n c?a em b.  Khng s? d?ng b?t c? cc s?n ph?m thu?c l no, bao g?m thu?c l d?ng ht, thu?c l d?ng nhai v thu?c l ?i?n t?. N?u qu v? c?n gip ?? ?? cai thu?c, hy h?i chuyn gia ch?m sc s?c kh?e. Qu v? c th? nh?n ???c h? tr? t?   v?n v cc ngu?n h? tr? khc ?? gip qu v? b? thu?c l.  Tun th? cc ch? d?n c?a chuyn gia ch?m sc y t? v? vi?c s? d?ng thu?c. C nh?ng lo?i thu?c l an ton ho?c khng an ton trong qu trnh mang thai.  T?p th? d?c th??ng xuyn theo ch? d?n c?a chuyn gia ch?m sc y t?. B? co th?t t? cung l m?t d?u hi?u t?t ?? ng?ng t?p th? d?c.  Ti?p t?c ?n cc b?a ?n th??ng xuyn, lnh m?nh.  M?c m?t chi?c o ng?c nng ?? t?t cho v b? nh?y c?m ?au.  Khng s? d?ng b?n t?m n??c nng, phng xng h?i, ho?c phng t?m h?i.  ?eo dy an ton c?a qu v? m?i lc khi li xe.  Trnh th?t s?ng, ph mai ch?a n?u chn, h?p v? sinh c?a mo, v ??t v? sinh dnh cho mo. Nh?ng th? ny mang  m?m b?nh c th? gy d? t?t b?m sinh cho em b.  U?ng vitamin tr??c khi sinh c?a qu v?.  U?ng 1500-2000 mg canxi m?i ngy b?t ??u t? tu?n th? 20 c?a thai k? cho ??n khi sinh con.  Hy th? dng thu?c lm m?m phn (n?u chuyn gia ch?m sc y t? c?a qu v? ch?p thu?n) n?u qu v? b? to bn. ?n thm th?c ?n giu ch?t x?, nh? rau t??i ho?c tri cy v ng? c?c nguyn h?t. U?ng nhi?u n??c ?? gi? cho n??c ti?u trong ho?c vng nh?t.  T?m b?n ng?i ?? lm d?u c?n ?au ho?c s? kh ch?u do b?nh tr? gy ra. S? d?ng kem tr? n?u chuyn gia ch?m sc y t? ch?p thu?n.  N?u qu v? b? gin t?nh m?ch, hy ?eo t?t h? tr?. Nng cao chn c?a qu v? trong 15 pht, 3-4 l?n m?t ngy. H?n ch? mu?i trong ch? ?? ?n c?a qu v?.  Trnh nng v?t n?ng, hay ?i giy th?p gt, v luy?n t?p t? th? ph h?p.  Ngh? ng?i nhi?u v?i ?i chn c?a qu v? nng cao n?u qu v? b? chu?t rt chn ho?c ?au vng th?t l?ng.  ??n khm nha s? n?u qu v? ch?a ??n trong th?i gian qu v? mang thai. S? d?ng m?t bn ch?i m?m ?? ch?i r?ng c?a qu v? v hy nh? nhng khi dng ch? nha khoa.  C th? ???c ti?p t?c c quan h? tnh d?c tr? khi chuyn gia ch?m sc y t? yu c?u khc.  Khng ?i du l?ch xa tr? khi n l hon ton c?n thi?t v ch? ?i v?i s? ch?p thu?n c?a chuyn gia ch?m sc y t?.  Tham gia cc l?p h?c tr??c khi sinh ?? hi?u, th?c hnh, v ??t cu h?i v? tr? d? v sinh con.  Th?c hi?n m?t l?n th? t?i b?nh vi?n.  Chu?n b? ti ?? ?? ?i vi?n.  Chu?n b? phng cho em b.  Ti?p t?c ??n m?i l?n h?n khm tr??c khi sinh theo ch? d?n c?a chuyn gia ch?m sc y t? c?a qu v?.  ?I KHM N?U:  Qu v? khng ch?c ch?n li?u qu v? c tr? d? ho?c li?u qu v? ? v? ?i hay ch?a.  Qu v? b? hoa m?t.  Qu v? b? co th?t vng ch?u, p l?c ln vng ch?u, ?au m ? ? vng b?ng c?a qu v?.  Qu v? b? bu?n nn, nn m?a ho?c tiu ch?y lin t?c.  Qu v? c d?ch m ??o mi kh ch?u.    Qu v? b? ?au khi ?i ti?u.  NGAY L?P T?C ?I KHM N?U:  Qu v? b? s?t.  Qu  v? b? r r? d?ch t? m ??o.  Qu v? c ra ??m mu ho?c ch?y mu t? m ??o.  Qu v? b? co th?t nghim tr?ng ho?c ?au ? b?ng.  Qu v? gi?m cn ho?c t?ng cn nhanh.  Qu v? b? kh th? cng v?i ?au ng?c.  Qu v? th?y s?ng b?t ng? ho?c r?t to ? m?t, tay, m?t c chn, bn chn ho?c c?ng chn.  Qu v? khng th?y em b c? ??ng trong h?n m?t gi?.  Qu v? b? ?au ??u nghim tr?ng m khng h?t sau khi dng thu?c.  Qu v? b? thay ??i th? l?c.  Thng tin ny khng nh?m m?c ?ch thay th? cho l?i khuyn m chuyn gia ch?m sc s?c kh?e ni v?i qu v?. Hy b?o ??m qu v? ph?i th?o lu?n b?t k? v?n ?? g m qu v? c v?i chuyn gia ch?m sc s?c kh?e c?a qu v?. Document Released: 10/21/2015 Document Revised: 10/21/2015 Document Reviewed: 11/25/2012 Elsevier Interactive Patient Education  2017 Elsevier Inc.  

## 2018-04-15 NOTE — Progress Notes (Signed)
   PRENATAL VISIT NOTE  Subjective:  Jillian Browning is a 27 y.o. G2P1001 at 3111w1d being seen today for ongoing prenatal care.  She is currently monitored for the following issues for this low-risk pregnancy and has Alpha thalassemia silent carrier; Supervision of other normal pregnancy, antepartum; Language barrier affecting health care; History of tuberculosis; Previous cesarean section; and Late prenatal care affecting pregnancy on their problem list.  Patient reports no complaints.  Contractions: Not present. Vag. Bleeding: None.  Movement: Present. Denies leaking of fluid.   The following portions of the patient's history were reviewed and updated as appropriate: allergies, current medications, past family history, past medical history, past social history, past surgical history and problem list. Problem list updated.  Objective:   Vitals:   04/15/18 1328  BP: 104/67  Pulse: 99  Weight: 159 lb (72.1 kg)    Fetal Status: Fetal Heart Rate (bpm): 157   Movement: Present     General:  Alert, oriented and cooperative. Patient is in no acute distress.  Skin: Skin is warm and dry. No rash noted.   Cardiovascular: Normal heart rate noted  Respiratory: Normal respiratory effort, no problems with respiration noted  Abdomen: Soft, gravid, appropriate for gestational age.  Pain/Pressure: Present     Pelvic: Cervical exam deferred        Extremities: Normal range of motion.  Edema: None  Mental Status: Normal mood and affect. Normal behavior. Normal judgment and thought content.   Assessment and Plan:  Pregnancy: G2P1001 at 6211w1d  1. Supervision of other normal pregnancy, antepartum --Anticipatory guidance about next visits/weeks of pregnancy given.  2. Previous cesarean section --Desires TOLAC --Reviewed risks/benefits of VBAC and RLTCS.  Pt selects TOLAC and consent signed today.  3. Language barrier affecting health care --[facility] [place] interpreter used for all  communication  Preterm labor symptoms and general obstetric precautions including but not limited to vaginal bleeding, contractions, leaking of fluid and fetal movement were reviewed in detail with the patient. Please refer to After Visit Summary for other counseling recommendations.  Return in about 2 weeks (around 04/29/2018).  No future appointments.  Sharen CounterLisa Leftwich-Kirby, CNM

## 2018-04-25 ENCOUNTER — Encounter: Payer: Self-pay | Admitting: *Deleted

## 2018-05-01 ENCOUNTER — Ambulatory Visit (INDEPENDENT_AMBULATORY_CARE_PROVIDER_SITE_OTHER): Payer: Medicaid Other | Admitting: Student

## 2018-05-01 DIAGNOSIS — Z3480 Encounter for supervision of other normal pregnancy, unspecified trimester: Secondary | ICD-10-CM

## 2018-05-01 NOTE — Patient Instructions (Signed)
Natural Family Planning Introduction Natural Family Planning (NFP) is a type of birth control without using any form of contraception. Women who use NFP should not have sexual intercourse when the ovary produces an egg (ovulation) during the menstrual cycle. The NFP method is safe and can prevent pregnancy. It is 75% effective when practiced right. The man needs to also understand this method of birth control and the woman needs to be aware of how her body functions during her menstrual cycle. NFP can also be used as a method of getting pregnant. HOW THE NFP METHOD WORKS  A woman's menstrual period usually happens every 28-30 days (it can vary from 23-35 days).  Ovulation happens 12-14 days before the start of the next menstrual period (the fertile period). The egg is fertile for 24 hours and the sperm can live for 3 days or more. If there is sexual intercourse at this time, pregnancy can occur. THERE ARE MANY TYPES OF NFP METHODS USED TO PREVENT PREGNANCY  The basal body temperature method. Often times, there is a slight increase of body temperature when a woman ovulates. Take your temperature every morning before getting out of bed. Write the temperature on a chart. An increase in the temperature shows ovulation has happened. Do not have sexual intercourse from the menstrual period up to three days after the increase in the temperature. Note that the body temperature may increase as a result of fever, restless sleep, and working schedules.  The ovulation cervical mucus method. During the menstrual cycle, the cervical mucus changes from dry and sticky to wet and slippery. Check the mucus of the vagina every day to look for these changes. Just before ovulation, the mucus becomes wet and slippery. On the last day of wetness, ovulation happens. To avoid getting pregnant, sexual intercourse is safe for about 10 days after the menstrual period and on the dry mucus days. Do not have sexual intercourse when  the mucus starts to show up and not until 4 days after the wet and slippery mucus goes away. Sexual intercourse after the 4 days have passed until the menstrual period starts is a safe time. Note that the mucus from the vagina can increase because of a vaginal or cervical infection, lubricants, some medicines, and sexual excitement.  The symptothermal method. This method uses both the temperature and the ovulation methods. Combine the two methods above to prevent pregnancy.  The calendar method. Record your menstrual periods and length of the cycles for 6 months. This is helpful when the menstrual cycle varies in the length of the cycle. The length of a menstrual cycle is from day 1 of the present menstrual period to day 1 of the next menstrual period. Then, find your fertile days of the month and do not have sexual intercourse during that time. You may need help from your health care provider to find out your fertile days. There are some signs of ovulation that may be helpful when trying to find the time of ovulation. This includes vaginal spotting or abdominal cramps during the middle of your menstrual cycle. Not all women have these symptoms. YOU SHOULD NOT USE NFP IF:  You have very irregular menstrual periods and may skip months.  You have abnormal bleeding.  You have a vaginal or cervical infection.  You are on medicines that can affect the vaginal mucus or body temperature. These medicines include antibiotics, thyroid medicines, and antihistamines (cold and allergy medicine). This information is not intended to replace advice given   to you by your health care provider. Make sure you discuss any questions you have with your health care provider. Document Released: 03/12/2008 Document Revised: 03/01/2016 Document Reviewed: 03/27/2013 Elsevier Interactive Patient Education  2017 Elsevier Inc.  

## 2018-05-01 NOTE — Progress Notes (Signed)
   PRENATAL VISIT NOTE  Subjective:  Jillian Browning is a 27 y.o. G2P1001 at 5666w3d being seen today for ongoing prenatal care.  She is currently monitored for the following issues for this low-risk pregnancy and has Alpha thalassemia silent carrier; Supervision of other normal pregnancy, antepartum; Language barrier affecting health care; History of tuberculosis; Previous cesarean section; and Late prenatal care affecting pregnancy on their problem list.  Patient reports no complaints.  Contractions: Not present. Vag. Bleeding: None.  Movement: Present. Denies leaking of fluid.   The following portions of the patient's history were reviewed and updated as appropriate: allergies, current medications, past family history, past medical history, past social history, past surgical history and problem list. Problem list updated.  Objective:   Vitals:   05/01/18 1415  BP: 109/71  Pulse: 99  Weight: 160 lb 12.8 oz (72.9 kg)    Fetal Status: Fetal Heart Rate (bpm): 152 Fundal Height: 32 cm Movement: Present     General:  Alert, oriented and cooperative. Patient is in no acute distress.  Skin: Skin is warm and dry. No rash noted.   Cardiovascular: Normal heart rate noted  Respiratory: Normal respiratory effort, no problems with respiration noted  Abdomen: Soft, gravid, appropriate for gestational age.  Pain/Pressure: Absent     Pelvic: Cervical exam deferred        Extremities: Normal range of motion.  Edema: None  Mental Status: Normal mood and affect. Normal behavior. Normal judgment and thought content.   Assessment and Plan:  Pregnancy: G2P1001 at 4566w3d  1. Supervision of other normal pregnancy, antepartum -No complaints, doing well. Still planning for VBAC.   Preterm labor symptoms and general obstetric precautions including but not limited to vaginal bleeding, contractions, leaking of fluid and fetal movement were reviewed in detail with the patient. Please refer to After Visit Summary for  other counseling recommendations.  Return in about 2 weeks (around 05/15/2018), or LROB.  Future Appointments  Date Time Provider Department Center  05/13/2018  1:15 PM Marylene LandKooistra, Karion Cudd Lorraine, [facility]  05/20/2018  1:15 PM Rolm BookbinderNeill, Caroline M, CNM [facility]  05/27/2018  2:15 PM Marylene LandKooistra, Jerricka Carvey Lorraine, CNM [facility]  06/03/2018 10:15 AM Leftwich-Kirby, Wilmer FloorLisa A, CNM [facility]    Marylene LandKathryn Lorraine Jamariyah Johannsen, [place]

## 2018-05-13 ENCOUNTER — Ambulatory Visit (INDEPENDENT_AMBULATORY_CARE_PROVIDER_SITE_OTHER): Payer: Medicaid Other | Admitting: Student

## 2018-05-13 DIAGNOSIS — Z3483 Encounter for supervision of other normal pregnancy, third trimester: Secondary | ICD-10-CM

## 2018-05-13 DIAGNOSIS — Z3480 Encounter for supervision of other normal pregnancy, unspecified trimester: Secondary | ICD-10-CM

## 2018-05-13 NOTE — Progress Notes (Signed)
   PRENATAL VISIT NOTE  Subjective:  Jillian Browning is a 27 y.o. G2P1001 at 8053w1d being seen today for ongoing prenatal care.  She is currently monitored for the following issues for this low-risk pregnancy and has Alpha thalassemia silent carrier; Supervision of other normal pregnancy, antepartum; Language barrier affecting health care; History of tuberculosis; Previous cesarean section; and Late prenatal care affecting pregnancy on their problem list.  Patient reports occasional swelling in her feet. .  Contractions: Not present. Vag. Bleeding: None.  Movement: Present. Denies leaking of fluid.   The following portions of the patient's history were reviewed and updated as appropriate: allergies, current medications, past family history, past medical history, past social history, past surgical history and problem list. Problem list updated.  Objective:   Vitals:   05/13/18 1324  BP: 106/70  Pulse: (!) 101  Weight: 165 lb (74.8 kg)    Fetal Status: Fetal Heart Rate (bpm): 156   Movement: Present     General:  Alert, oriented and cooperative. Patient is in no acute distress.  Skin: Skin is warm and dry. No rash noted.   Cardiovascular: Normal heart rate noted  Respiratory: Normal respiratory effort, no problems with respiration noted  Abdomen: Soft, gravid, appropriate for gestational age.  Pain/Pressure: Absent     Pelvic: Cervical exam deferred        Extremities: Normal range of motion.  Edema: Trace  Mental Status: Normal mood and affect. Normal behavior. Normal judgment and thought content.   Assessment and Plan:  Pregnancy: G2P1001 at 3653w1d  1. Supervision of other normal pregnancy, antepartum -Patient doing well; no complaints.  -plan for cultures next week Preterm labor symptoms and general obstetric precautions including but not limited to vaginal bleeding, contractions, leaking of fluid and fetal movement were reviewed in detail with the patient. Please refer to After Visit  Summary for other counseling recommendations.  No follow-ups on file.  Future Appointments  Date Time Provider Department Center  05/20/2018  1:15 PM Rolm Bookbindereill, Caroline M, [facility]M [facility]  05/27/2018  2:15 PM Crisoforo OxfordKooistra, Charlesetta GaribaldiKathryn Lorraine, CNM [facility]  06/03/2018 10:15 AM Leftwich-Kirby, Wilmer FloorLisa A, CNM [facility]    Marylene LandKathryn Lorraine Kooistra, [place]

## 2018-05-13 NOTE — Patient Instructions (Signed)

## 2018-05-20 ENCOUNTER — Other Ambulatory Visit (HOSPITAL_COMMUNITY)
Admission: RE | Admit: 2018-05-20 | Discharge: 2018-05-20 | Disposition: A | Discharge status: Home / Self Care | Payer: Medicaid Other | Source: Ambulatory Visit

## 2018-05-20 ENCOUNTER — Ambulatory Visit (INDEPENDENT_AMBULATORY_CARE_PROVIDER_SITE_OTHER): Payer: Medicaid Other

## 2018-05-20 VITALS — BP 109/72 | HR 93 | Wt 167.0 lb

## 2018-05-20 DIAGNOSIS — O34219 Maternal care for unspecified type scar from previous cesarean delivery: Secondary | ICD-10-CM

## 2018-05-20 DIAGNOSIS — Z3A36 36 weeks gestation of pregnancy: Secondary | ICD-10-CM | POA: Diagnosis not present

## 2018-05-20 DIAGNOSIS — Z789 Other specified health status: Secondary | ICD-10-CM

## 2018-05-20 DIAGNOSIS — Z3483 Encounter for supervision of other normal pregnancy, third trimester: Secondary | ICD-10-CM | POA: Diagnosis not present

## 2018-05-20 DIAGNOSIS — Z3480 Encounter for supervision of other normal pregnancy, unspecified trimester: Secondary | ICD-10-CM

## 2018-05-20 DIAGNOSIS — Z98891 History of uterine scar from previous surgery: Secondary | ICD-10-CM

## 2018-05-20 NOTE — Progress Notes (Addendum)
   PRENATAL VISIT NOTE  Subjective:  Jillian Browning is a 27 y.o. G2P1001 at 6723w1d being seen today for ongoing prenatal care.  She is currently monitored for the following issues for this low-risk pregnancy and has Alpha thalassemia silent carrier; Supervision of other normal pregnancy, antepartum; Language barrier affecting health care; History of tuberculosis; Previous cesarean section; and Late prenatal care affecting pregnancy on their problem list.  Patient reports no complaints.  Contractions: Not present. Vag. Bleeding: None.  Movement: Present. Denies leaking of fluid.   The following portions of the patient's history were reviewed and updated as appropriate: allergies, current medications, past family history, past medical history, past social history, past surgical history and problem list. Problem list updated.  Objective:   Vitals:   05/20/18 1319  BP: 109/72  Pulse: 93  Weight: 167 lb (75.8 kg)    Fetal Status: Fetal Heart Rate (bpm): 168 Fundal Height: 36 cm Movement: Present     General:  Alert, oriented and cooperative. Patient is in no acute distress.  Skin: Skin is warm and dry. No rash noted.   Cardiovascular: Normal heart rate noted  Respiratory: Normal respiratory effort, no problems with respiration noted  Abdomen: Soft, gravid, appropriate for gestational age.  Pain/Pressure: Absent     Pelvic: Cervical exam performed Dilation: Closed Effacement (%): Thick Station: Ballotable  Extremities: Normal range of motion.  Edema: Trace  Mental Status: Normal mood and affect. Normal behavior. Normal judgment and thought content.   Assessment and Plan:  Pregnancy: G2P1001 at 5723w1d  1. Supervision of other normal pregnancy, antepartum - Culture, beta strep (group b only) - [facility]lam[facility]  2. Previous cesarean section -Desires TOLAC, consent signed 7/9  3. Language barrier affecting health care -Vietnamese   Preterm labor symptoms and  general obstetric precautions including but not limited to vaginal bleeding, contractions, leaking of fluid and fetal movement were reviewed in detail with the patient. Please refer to After Visit Summary for other counseling recommendations.  Return in about 1 week (around 05/27/2018) for Return OB visit.  Future Appointments  Date Time Provider Department Center  05/27/2018  2:15 PM Madlyn FrankelKooistra, Kathryn Lorraine, [facility]  06/03/2018 10:15 AM Leftwich-Kirby, Wilmer FloorLisa A, CNM [facility]    Margarita RanaHarrison D Synda Bagent, [place]

## 2018-05-20 NOTE — Patient Instructions (Signed)
Fetal Movement Counts Patient Name: ________________________________________________ Patient Due Date: ____________________ What is a fetal movement count? A fetal movement count is the number of times that you feel your baby move during a certain amount of time. This may also be called a fetal kick count. A fetal movement count is recommended for every pregnant woman. You may be asked to start counting fetal movements as early as week 28 of your pregnancy. Pay attention to when your baby is most active. You may notice your baby's sleep and wake cycles. You may also notice things that make your baby move more. You should do a fetal movement count:  When your baby is normally most active.  At the same time each day.  A good time to count movements is while you are resting, after having something to eat and drink. How do I count fetal movements? 1. Find a quiet, comfortable area. Sit, or lie down on your side. 2. Write down the date, the start time and stop time, and the number of movements that you felt between those two times. Take this information with you to your health care visits. 3. For 2 hours, count kicks, flutters, swishes, rolls, and jabs. You should feel at least 10 movements during 2 hours. 4. You may stop counting after you have felt 10 movements. 5. If you do not feel 10 movements in 2 hours, have something to eat and drink. Then, keep resting and counting for 1 hour. If you feel at least 4 movements during that hour, you may stop counting. Contact a health care provider if:  You feel fewer than 4 movements in 2 hours.  Your baby is not moving like he or she usually does. Date: ____________ Start time: ____________ Stop time: ____________ Movements: ____________ Date: ____________ Start time: ____________ Stop time: ____________ Movements: ____________ Date: ____________ Start time: ____________ Stop time: ____________ Movements: ____________ Date: ____________ Start time:  ____________ Stop time: ____________ Movements: ____________ Date: ____________ Start time: ____________ Stop time: ____________ Movements: ____________ Date: ____________ Start time: ____________ Stop time: ____________ Movements: ____________ Date: ____________ Start time: ____________ Stop time: ____________ Movements: ____________ Date: ____________ Start time: ____________ Stop time: ____________ Movements: ____________ Date: ____________ Start time: ____________ Stop time: ____________ Movements: ____________ This information is not intended to replace advice given to you by your health care provider. Make sure you discuss any questions you have with your health care provider. Document Released: 10/24/2006 Document Revised: 05/23/2016 Document Reviewed: 11/03/2015 Elsevier Interactive Patient Education  2018 [employer]. [employer] of the uterus can occur throughout pregnancy, but they are not always a sign that you are in labor. You may have practice contractions called Braxton Hicks contractions. These false labor contractions are sometimes confused with true labor. What are [place] contractions? Braxton Hicks contractions are tightening movements that occur in the muscles of the uterus before labor. Unlike true labor contractions, these contractions do not result in opening (dilation) and thinning of the cervix. Toward the end of pregnancy (32-34 weeks), Braxton Hicks contractions can happen more often and may become stronger. These contractions are sometimes difficult to tell apart from true labor because they can be very uncomfortable. You should not feel embarrassed if you go to the hospital with false labor. Sometimes, the only way to tell if you are in true labor is for your health care provider to look for changes in the cervix. The health care provider will do a physical exam and may monitor your contractions. If  you are not in true labor, the exam  should show that your cervix is not dilating and your water has not broken. If there are other health problems associated with your pregnancy, it is completely safe for you to be sent home with false labor. You may continue to have Braxton Hicks contractions until you go into true labor. How to tell the difference between true labor and false labor True labor  Contractions last 30-70 seconds.  Contractions become very regular.  Discomfort is usually felt in the top of the uterus, and it spreads to the lower abdomen and low back.  Contractions do not go away with walking.  Contractions usually become more intense and increase in frequency.  The cervix dilates and gets thinner. False labor  Contractions are usually shorter and not as strong as true labor contractions.  Contractions are usually irregular.  Contractions are often felt in the front of the lower abdomen and in the groin.  Contractions may go away when you walk around or change positions while lying down.  Contractions get weaker and are shorter-lasting as time goes on.  The cervix usually does not dilate or become thin. Follow these instructions at home:  Take over-the-counter and prescription medicines only as told by your health care provider.  Keep up with your usual exercises and follow other instructions from your health care provider.  Eat and drink lightly if you think you are going into labor.  If Braxton Hicks contractions are making you uncomfortable: ? Change your position from lying down or resting to walking, or change from walking to resting. ? Sit and rest in a tub of warm water. ? Drink enough fluid to keep your urine pale yellow. Dehydration may cause these contractions. ? Do slow and deep breathing several times an hour.  Keep all follow-up prenatal visits as told by your health care provider. This is important. Contact a health care provider if:  You have a fever.  You have continuous pain  in your abdomen. Get help right away if:  Your contractions become stronger, more regular, and closer together.  You have fluid leaking or gushing from your vagina.  You pass blood-tinged mucus (bloody show).  You have bleeding from your vagina.  You have low back pain that you never had before.  You feel your baby's head pushing down and causing pelvic pressure.  Your baby is not moving inside you as much as it used to. Summary  Contractions that occur before labor are called Braxton Hicks contractions, false labor, or practice contractions.  Braxton Hicks contractions are usually shorter, weaker, farther apart, and less regular than true labor contractions. True labor contractions usually become progressively stronger and regular and they become more frequent.  Manage discomfort from [facility] Center[facility] Hicks contractions by changing position, resting in a warm bath, drinking plenty of water, or practicing deep breathing. This information is not intended to replace advice given to you by your health care provider. Make sure you discuss any questions you have with your health care provider. Document Released: 02/07/2017 Document Revised: 02/07/2017 Document Reviewed: 02/07/2017 Elsevier Interactive Patient Education  2018 [employer]  Safe Medications in Pregnancy   Acne: Benzoyl Peroxide Salicylic Acid  Backache/Headache: Tylenol: 2 regular strength every 4 hours OR              2 Extra strength every 6 hours  Colds/Coughs/Allergies: Benadryl (alcohol free) 25 mg every 6 hours as needed Breath right strips Claritin Cepacol throat lozenges Chloraseptic  throat spray Cold-Eeze- up to three times per day Cough drops, alcohol free Flonase (by prescription only) Guaifenesin Mucinex Robitussin DM (plain only, alcohol free) Saline nasal spray/drops Sudafed (pseudoephedrine) & Actifed ** use only after [redacted] weeks gestation and if you do not have high blood pressure Tylenol Vicks  Vaporub Zinc lozenges Zyrtec   Constipation: Colace Ducolax suppositories Fleet enema Glycerin suppositories Metamucil Milk of magnesia Miralax Senokot Smooth move tea  Diarrhea: Kaopectate Imodium A-D  *NO pepto Bismol  Hemorrhoids: Anusol Anusol HC Preparation H Tucks  Indigestion: Tums Maalox Mylanta Zantac  Pepcid  Insomnia: Benadryl (alcohol free) 25mg  every 6 hours as needed Tylenol PM Unisom, no Gelcaps  Leg Cramps: Tums MagGel  Nausea/Vomiting:  Bonine Dramamine Emetrol Ginger extract Sea bands Meclizine  Nausea medication to take during pregnancy:  Unisom (doxylamine succinate 25 mg tablets) Take one tablet daily at bedtime. If symptoms are not adequately controlled, the dose can be increased to a maximum recommended dose of two tablets daily (1/2 tablet in the morning, 1/2 tablet mid-afternoon and one at bedtime). Vitamin B6 100mg  tablets. Take one tablet twice a day (up to 200 mg per day).  Skin Rashes: Aveeno products Benadryl cream or 25mg  every 6 hours as needed Calamine Lotion 1% cortisone cream  Yeast infection: Gyne-lotrimin 7 Monistat 7   **If taking multiple medications, please check labels to avoid duplicating the same active ingredients **take medication as directed on the label ** Do not exceed 4000 mg of tylenol in 24 hours **Do not take medications that contain aspirin or ibuprofen

## 2018-05-21 LAB — GC/CHLAMYDIA PROBE AMP (~~LOC~~) NOT AT ARMC
CHLAMYDIA, DNA PROBE: NEGATIVE
NEISSERIA GONORRHEA: NEGATIVE

## 2018-05-24 LAB — CULTURE, BETA STREP (GROUP B ONLY): Strep Gp B Culture: NEGATIVE

## 2018-05-27 ENCOUNTER — Ambulatory Visit (INDEPENDENT_AMBULATORY_CARE_PROVIDER_SITE_OTHER): Payer: Medicaid Other | Admitting: Student

## 2018-05-27 VITALS — BP 110/76 | HR 92 | Wt 167.8 lb

## 2018-05-27 DIAGNOSIS — Z98891 History of uterine scar from previous surgery: Secondary | ICD-10-CM

## 2018-05-27 DIAGNOSIS — Z3480 Encounter for supervision of other normal pregnancy, unspecified trimester: Secondary | ICD-10-CM

## 2018-05-27 NOTE — Patient Instructions (Signed)
Ba thng th? th? ba c?a thai k? (Third Trimester of Pregnancy) Ba thng th? ba l t? tu?n 29 ??n tu?n 42, thng th? 7 ??n thng th? 9. Ba thng th? ba c?ng l th?i gian khi bo thai ?ang pht tri?n nhanh chng. Vo cu?i thng th? chn, bo thai di kho?ng 20 inch v n?ng kho?ng 6-10 pao. C? TH? THAY ??I C? th? c?a qu v? tr?i qua r?t nhi?u thay ??i trong th?i gian mang thai. Nh?ng thay ??i khc nhau ? cc ph? n? khc nhau.  Cn n?ng c?a qu v? s? ti?p t?c t?ng. Qu v? c th? t?ng ???c 25-35 pao (11 - 16 kg) vo cu?i thai k?.  Qu v? c th? b?t ??u c cc v?t n?t trn hng, b?ng v ng?c.  Qu v? c th? ?i ti?u th??ng xuyn h?n v bo thai di chuy?n xu?ng th?p h?n vo khung ch?u c?a qu v? v ? vo bng quang c?a qu v?.  Qu v? c th? b? ho?c ti?p t?c b? ? nng do vi?c mang thai.  Qu v? c th? b? to bn v m?t s? hoc mn nh?t ??nh ?ang lm cc c? c ch?c n?ng ??y ch?t th?i qua ???ng ru?t ho?t ??ng ch?m l?i.  Qu v? c th? b? b?nh tr? ho?c s?ng, ph?ng t?nh m?ch (gin t?nh m?ch).  Qu v? c th? ?au khung ch?u v t?ng cn v cc hoc mn mang thai lm l?ng kh?p n?i gi?a cc x??ng ? khung ch?u c?a qu v?. ?au l?ng c th? do s? c? g?ng qu s?c c?a c? b?p h? tr? t? th? c?a qu v?.  Qu v? c nh?ng thay ??i v? tc. Nh?ng thay ??i ny c th? bao g?m tc dy ln, tc m?c nhanh h?n v thay ??i v? k?t c?u. M?t s? ph? n? c?ng b? r?ng tc trong khi ho?c sau khi mang thai, ho?c tc c?m th?y kh ho?c m?ng. Tc c?a qu v? s? c nhi?u kh? n?ng s? tr? l?i bnh th??ng sau khi em b ???c sinh ra.  Ng?c qu v? s? ti?p t?c pht tri?n v nh?y c?m ?au. Ch?t d?ch mu vng c th? r? t? v c?a qu v? g?i l s?a non.  R?n c?a qu v? c th? l?i ra.  Qu v? c th? c?m th?y kh th? v t? cung c?a qu v? to ra.  Qu v? c th? nh?n th?y thai nhi "r?i xu?ng", ho?c di chuy?n xu?ng th?p h?n trong b?ng c?a qu v?.  Qu v? c th? b? ti?t d?ch nh?y c mu. ?i?u ny th??ng x?y ra m?t vi ngy ??n m?t tu?n tr??c khi b?t ??u  sinh ??.  C? t? cung c?a qu v? tr? nn m?ng v m?m (b? m?ng c? t? cung) g?n ngy d? sinh c?a qu v?. TRNG ??I ?I?U G TRONG NH?NG L?N KHM TR??C KHI SINH Qu v? s? ???c khm tr??c khi sinh 2 tu?n m?t l?n cho ??n tu?n 36. Sau ?, qu v? s? ???c khm hng tu?n tr??c khi sinh. Trong m?t l?n khm tr??c khi sinh:  Qu v? s? ???c cn n?ng ?? ??m b?o qu v? v thai nhi ?ang pht tri?n bnh th??ng.  Qu v? ???c ?o huy?t p.  Qu v? s? ???c ?o b?ng ?? theo di s? pht tri?n c?a b.  S? nghe th?y nh?p tim thai.  B?t k? k?t qu? ki?m tra no trong l?n khm tr??c ? s? ???c th?o lu?n.  Qu v? c   th? ???c ki?m tra c? t? cung g?n ngy d? sinh ?? xem c? t? cung ? m?ng ch?a. Lc ???c kho?ng 36 tu?n, chuyn gia ch?m sc y t? c?a qu v? s? ki?m tra c? t? cung c?a qu v?. Cng lc ?, chuyn gia ch?m sc y t? c?ng s? th?c hi?n ki?m tra ch?t ti?t ra c?a m m ??o. Ki?m tra ny l ?? xc ??nh xem c m?t lo?i vi khu?n, lin c?u nhm B, hay khng. Chuyn gia ch?m sc y t? c?a qu v? s? gi?i thch thm. Chuyn gia ch?m sc y t? c th? h?i qu v?:  K? ho?ch sinh c?a qu v? l g.  Qu v? c?m th?y th? no.  Qu v? c c?m th?y em b c? ??ng.  Li?u qu v? c b?t k? tri?u ch?ng b?t th??ng no, ch?ng h?n nh? r r? ch?t l?ng, ch?y mu, ?au ??u nghim tr?ng ho?c co th?t ? b?ng.  Li?u qu v? c ?ang s? d?ng b?t k? s?n ph?m thu?c l no, bao g?m thu?c l d?ng ht, thu?c l d?ng nhai v thu?c l ?i?n t?.  Li?u qu v? c b?t k? cu h?i no khng. Nh?ng ki?m tra ho?c sng l?c khc c th? ???c th?c hi?n trong k? ba thng th? hai c?a qu v? bao g?m:  Cc xt nghi?m mu ki?m tra n?ng ?? s?t th?p (thi?u mu).  Th? thai ?? ki?m tra s?c kh?e, m?c ?? ho?t ??ng v s? pht tri?n c?a thai nhi. Th? nghi?m ???c th?c hi?n n?u qu v? c m?t s? tnh tr?ng b?nh l, ho?c n?u c nh?ng v?n ?? trong qu trnh mang thai.  Xt nghi?m HIV (Vi rt suy gi?m mi?n d?ch ? ng??i). N?u qu v? c nguy c? cao, qu v? c th? ???c ki?m tra HIV  trong ba thng th? ba c?a k? mang thai. CHUY?N D? GI? Qu v? c th? c?m th?y co th?t nh?, khng ??u m d?n d?n s? h?t. Chng ???c g?i l c?n co th?t Braxton Hicks, hay chuy?n d? gi?. Co th?t c th? ko di trong nhi?u gi?, nhi?u ngy, ho?c th?m ch nhi?u tu?n tr??c khi chuy?n d? th?t. N?u c?n co th?t ??u ??n, t?ng c??ng, ho?c tr? nn ?au ??n, t?t nh?t l ph?i ??n g?p chuyn gia ch?m sc y t? ?? khm. CC D?U HI?U TR? D?  Co th?t gi?ng nh? khi c kinh nguy?t.  Cc c?n co th?t cch nhau 5 pht ho?c nhanh h?n.  Co th?t b?t ??u t? ??nh t? cung v lan xu?ng b?ng d??i v l?ng.  C?m gic p l?c ln khung ch?u t?ng ln ho?c ?au l?ng.  Ti?t d?ch nh?y c n??c ho?c mu t? m ??o. N?u qu v? c b?t k? nh?ng d?u hi?u ny tr??c tu?n 37 c?a thai k?, g?i cho chuyn gia ch?m sc y t? ngay l?p t?c. Qu v? s? c?n ??n b?nh vi?n ?? ???c ki?m tra ngay l?p t?c. H??NG D?N CH?M SC T?I NH  Trnh t?t c? thu?c l, th?o d??c, r??u v ma ty khng ???c k ??n. Cc ha ch?t ny ?nh h??ng ??n s? hnh thnh v pht tri?n c?a em b.  Khng s? d?ng b?t c? cc s?n ph?m thu?c l no, bao g?m thu?c l d?ng ht, thu?c l d?ng nhai v thu?c l ?i?n t?. N?u qu v? c?n gip ?? ?? cai thu?c, hy h?i chuyn gia ch?m sc s?c kh?e. Qu v? c th? nh?n ???c h? tr? t?   v?n v cc ngu?n h? tr? khc ?? gip qu v? b? thu?c l.  Tun th? cc ch? d?n c?a chuyn gia ch?m sc y t? v? vi?c s? d?ng thu?c. C nh?ng lo?i thu?c l an ton ho?c khng an ton trong qu trnh mang thai.  T?p th? d?c th??ng xuyn theo ch? d?n c?a chuyn gia ch?m sc y t?. B? co th?t t? cung l m?t d?u hi?u t?t ?? ng?ng t?p th? d?c.  Ti?p t?c ?n cc b?a ?n th??ng xuyn, lnh m?nh.  M?c m?t chi?c o ng?c nng ?? t?t cho v b? nh?y c?m ?au.  Khng s? d?ng b?n t?m n??c nng, phng xng h?i, ho?c phng t?m h?i.  ?eo dy an ton c?a qu v? m?i lc khi li xe.  Trnh th?t s?ng, ph mai ch?a n?u chn, h?p v? sinh c?a mo, v ??t v? sinh dnh cho mo. Nh?ng th? ny mang  m?m b?nh c th? gy d? t?t b?m sinh cho em b.  U?ng vitamin tr??c khi sinh c?a qu v?.  U?ng 1500-2000 mg canxi m?i ngy b?t ??u t? tu?n th? 20 c?a thai k? cho ??n khi sinh con.  Hy th? dng thu?c lm m?m phn (n?u chuyn gia ch?m sc y t? c?a qu v? ch?p thu?n) n?u qu v? b? to bn. ?n thm th?c ?n giu ch?t x?, nh? rau t??i ho?c tri cy v ng? c?c nguyn h?t. U?ng nhi?u n??c ?? gi? cho n??c ti?u trong ho?c vng nh?t.  T?m b?n ng?i ?? lm d?u c?n ?au ho?c s? kh ch?u do b?nh tr? gy ra. S? d?ng kem tr? n?u chuyn gia ch?m sc y t? ch?p thu?n.  N?u qu v? b? gin t?nh m?ch, hy ?eo t?t h? tr?. Nng cao chn c?a qu v? trong 15 pht, 3-4 l?n m?t ngy. H?n ch? mu?i trong ch? ?? ?n c?a qu v?.  Trnh nng v?t n?ng, hay ?i giy th?p gt, v luy?n t?p t? th? ph h?p.  Ngh? ng?i nhi?u v?i ?i chn c?a qu v? nng cao n?u qu v? b? chu?t rt chn ho?c ?au vng th?t l?ng.  ??n khm nha s? n?u qu v? ch?a ??n trong th?i gian qu v? mang thai. S? d?ng m?t bn ch?i m?m ?? ch?i r?ng c?a qu v? v hy nh? nhng khi dng ch? nha khoa.  C th? ???c ti?p t?c c quan h? tnh d?c tr? khi chuyn gia ch?m sc y t? yu c?u khc.  Khng ?i du l?ch xa tr? khi n l hon ton c?n thi?t v ch? ?i v?i s? ch?p thu?n c?a chuyn gia ch?m sc y t?.  Tham gia cc l?p h?c tr??c khi sinh ?? hi?u, th?c hnh, v ??t cu h?i v? tr? d? v sinh con.  Th?c hi?n m?t l?n th? t?i b?nh vi?n.  Chu?n b? ti ?? ?? ?i vi?n.  Chu?n b? phng cho em b.  Ti?p t?c ??n m?i l?n h?n khm tr??c khi sinh theo ch? d?n c?a chuyn gia ch?m sc y t? c?a qu v?.  ?I KHM N?U:  Qu v? khng ch?c ch?n li?u qu v? c tr? d? ho?c li?u qu v? ? v? ?i hay ch?a.  Qu v? b? hoa m?t.  Qu v? b? co th?t vng ch?u, p l?c ln vng ch?u, ?au m ? ? vng b?ng c?a qu v?.  Qu v? b? bu?n nn, nn m?a ho?c tiu ch?y lin t?c.  Qu v? c d?ch m ??o mi kh ch?u.    Qu v? b? ?au khi ?i ti?u.  NGAY L?P T?C ?I KHM N?U:  Qu v? b? s?t.  Qu  v? b? r r? d?ch t? m ??o.  Qu v? c ra ??m mu ho?c ch?y mu t? m ??o.  Qu v? b? co th?t nghim tr?ng ho?c ?au ? b?ng.  Qu v? gi?m cn ho?c t?ng cn nhanh.  Qu v? b? kh th? cng v?i ?au ng?c.  Qu v? th?y s?ng b?t ng? ho?c r?t to ? m?t, tay, m?t c chn, bn chn ho?c c?ng chn.  Qu v? khng th?y em b c? ??ng trong h?n m?t gi?.  Qu v? b? ?au ??u nghim tr?ng m khng h?t sau khi dng thu?c.  Qu v? b? thay ??i th? l?c.  Thng tin ny khng nh?m m?c ?ch thay th? cho l?i khuyn m chuyn gia ch?m sc s?c kh?e ni v?i qu v?. Hy b?o ??m qu v? ph?i th?o lu?n b?t k? v?n ?? g m qu v? c v?i chuyn gia ch?m sc s?c kh?e c?a qu v?. Document Released: 10/21/2015 Document Revised: 10/21/2015 Document Reviewed: 11/25/2012 Elsevier Interactive Patient Education  2017 Elsevier Inc.  

## 2018-05-27 NOTE — Progress Notes (Signed)
   PRENATAL VISIT NOTE  Subjective:  Jillian Browning is a 27 y.o. G2P1001 at 2776w1d being seen today for ongoing prenatal care.  She is currently monitored for the following issues for this low-risk pregnancy and has Alpha thalassemia silent carrier; Supervision of other normal pregnancy, antepartum; Language barrier affecting health care; History of tuberculosis; Previous cesarean section; and Late prenatal care affecting pregnancy on their problem list.  Patient reports no complaints.  Contractions: Not present. Vag. Bleeding: None.  Movement: Present. Denies leaking of fluid.   The following portions of the patient's history were reviewed and updated as appropriate: allergies, current medications, past family history, past medical history, past social history, past surgical history and problem list. Problem list updated.  Objective:   Vitals:   05/27/18 1444  BP: 110/76  Pulse: 92  Weight: 167 lb 12.8 oz (76.1 kg)    Fetal Status: Fetal Heart Rate (bpm): 154 Fundal Height: 37 cm Movement: Present     General:  Alert, oriented and cooperative. Patient is in no acute distress.  Skin: Skin is warm and dry. No rash noted.   Cardiovascular: Normal heart rate noted  Respiratory: Normal respiratory effort, no problems with respiration noted  Abdomen: Soft, gravid, appropriate for gestational age.  Pain/Pressure: Present     Pelvic: Cervical exam deferred        Extremities: Normal range of motion.  Edema: Trace  Mental Status: Normal mood and affect. Normal behavior. Normal judgment and thought content.   Assessment and Plan:  Pregnancy: G2P1001 at 6876w1d  1. Previous cesarean section -Still desires TOLAC  2. Supervision of other normal pregnancy, antepartum Doing well; no complaints. Does not desire PP contraception.   Term labor symptoms and general obstetric precautions including but not limited to vaginal bleeding, contractions, leaking of fluid and fetal movement were reviewed in detail  with the patient. Please refer to After Visit Summary for other counseling recommendations.  Return in about 1 week (around 06/03/2018), or LROB.  Future Appointments  Date Time Provider Department Center  06/03/2018 10:15 AM [place], Wilmer FloorLisa A, CNM [facility]    Charlesetta GaribaldiKathryn Lorraine [place], [place]

## 2018-06-03 ENCOUNTER — Ambulatory Visit (INDEPENDENT_AMBULATORY_CARE_PROVIDER_SITE_OTHER): Payer: Medicaid Other | Admitting: Advanced Practice Midwife

## 2018-06-03 VITALS — BP 105/71 | HR 84 | Wt 168.6 lb

## 2018-06-03 DIAGNOSIS — Z3480 Encounter for supervision of other normal pregnancy, unspecified trimester: Secondary | ICD-10-CM

## 2018-06-03 DIAGNOSIS — Z98891 History of uterine scar from previous surgery: Secondary | ICD-10-CM

## 2018-06-03 NOTE — Patient Instructions (Signed)
Labor Precautions Reasons to come to [facility]:  1.  Contractions are  5 minutes apart or less, each last 1 minute, these have been going on for 1-2 hours, and you cannot walk or talk during them 2.  You have a large gush of fluid, or a trickle of fluid that will not stop and you have to wear a pad 3.  You have bleeding that is bright red, heavier than spotting--like menstrual bleeding (spotting can be normal in early labor or after a check of your cervix) 4.  You do not feel the baby moving like he/she normally does  Sinh n? qua ???ng m ??o Vaginal Delivery Sinh n? qua ???ng m ??o c ngh?a l qu v? sinh con b?ng cch ??y em b ra kh?i ?ng ?? (m ??o). ??i ng? nhn vin y t? s? tr? gip qu v? tr??c, trong v sau khi sinh n? qua ???ng m ??o. Tr?i nghi?m sinh con l duy nh?t ??i v?i m?i ng??i ph? n? v m?i l?n mang thai v tr?i nghi?m sinh con s? thay ??i ty thu?c vo n?i m qu v? ch?n ?? sinh n?. Ti c?n lm g ?? chu?n b? cho s? ra ??i c?a em b? Tr??c khi em b c?a qu v? ???c sinh ra, ?i?u quan tr?ng l qu v? trao ??i v?i chuyn gia ch?m [place] s?c kh?e v?:  Cc ?u tin chuy?n d? v sinh n? c?a qu v?. Nh?ng y?u t? ny c th? bao g?m: ? Cc thu?c m qu v? c th? ???c cho dng. ? Qu v? s? qu?n l c?n ?au nh? th? no. ?i?u ny c th? bao g?m cc k? thu?t gi?m ?au khng dng thu?c ho?c gi?m ?au b?ng thu?c tim ch?ng h?n nh? thu?c gy t ngoi mng c?ng. ? Qu v? v em b s? ???c theo di nh? th? no trong khi chuy?n d? v sinh n?. ? Ai c th? ? trong phng sinh v?i qu v?. ? Cc c?m gic c?a qu v? v? vi?c sinh m? (m? l?y thai, hay m? b?t con) n?u vi?c ny tr? nn c?n thi?t. ? Cc c?m gic c?a qu v? v? vi?c nh?n mu hi?n thng qua ???ng truy?n t?nh m?ch (truy?n mu) n?u vi?c ny tr? nn c?n thi?t.  Qu v? c th? lm nh?ng vi?c ny hay khng: ? Ch?p hnh ho?c quay video lc sinh. ? ?n trong khi chuy?n d? v sinh. ? Di chuy?n xung quanh, ?i l?i ho?c thay ??i t? th? trong khi chuy?n d? v sinh  n?.  C?n d? ki?n ?i?u g sau khi em b ???c sinh ra, ch?ng h?n nh?: ? C tr hon k?p v c?t dy r?n hay khng. ? Ai s? ch?m [place] cho em b ngay sau khi sinh ra. ? Cc thu?c ho?c xt nghi?m c th? ???c khuy?n ngh? cho em b. ? B?nh vi?n ho?c trung tm h? sinh c h? tr? cho em b b hay khng. ? Qu v? s? n?m t?i b?nh vi?n ho?c trung tm h? sinh trong bao lu.  B?t k? tnh tr?ng b?nh l no m qu v? c s? c th? ?nh h??ng ??n em b ho?c tr?i nghi?m chuy?n d? v sinh n? c?a qu v? nh? th? no.  ?? chu?n b? cho s? ra ??i c?a em b, qu v? c?ng c?n:  ??n t?t c? cc bu?i khm ch?m [place] s?c kh?e tr??c khi sinh n? (khm tr??c khi sinh) theo khuy?n ngh? c?a chuyn gia ch?m [place]  s?c kh?e. ?i?u ny c vai tr quan tr?ng.  Chu?n b? nh c?a cho s? xu?t hi?n c?a em b. ??m b?o qu v? c: ? T lt. ? Qu?n o em b. ? D?ng c? cho b. ? B? tr ch? ng? an ton cho qu v? v em b.  L?p gh? ng?i  t cho tr? em trong xe c?a qu v?. Nh? m?t ng??i l?p ??t gh? ng?i  t cho tr? em c ch?ng nh?n ki?m tra gh? ng?i  t cho tr? em c?a qu v? ?? ??m b?o n ? ???c l?p ??t an ton.  Ngh? v? vi?c ai s? gip qu v? v em b ? nh trong t nh?t vi tu?n ??u tin sau khi sinh.  Ti c th? d? ki?n ?i?u g khi v? ??n trung tm h? sinh ho?c b?nh vi?n? Khi qu v? ?ang chuy?n d? v vo b?nh vi?n ho?c trung tm h? sinh, chuyn gia ch?m [place] s?c kh?e c th?:  Xem xt ti?n s? mang [place] c?a qu v? v b?t k? lo ng?i no qu v? c.  ??t m?t ???ng truy?n t?nh m?ch vo m?t trong cc t?nh m?ch c?a qu v?. ???ng truy?n ny ???c dng ?? cung c?p d?ch v thu?c cho qu v?.  Ki?m tra huy?t p, m?ch, nhi?t ?? v nh?p tim (cc d?u hi?u sinh t?n) c?a qu v?.  Ki?m tra xem ti n??c ?i (ti ?i) c?a qu v? ? v? (rch) ch?a.  Trao ??i v?i qu v? v? k? ho?ch sinh n? v th?o lu?n cc ph??ng n ki?m sot ?au.  Theo di Chuyn gia ch?m [place] s?c kh?e c th? theo di cc c?n co bp (theo di t? cung) c?a qu v? v nh?p tim c?a em b (theo  di [place] nhi). Qu v? c th? c?n ???c theo di:  Th??ng xuyn, nh?ng khng lin t?c (cch qung).  Ton b? th?i gian hay trong th?i gian di (lin t?c). Theo di lin t?c c th? c?n thi?t n?u: ? Qu v? ?ang dng m?t s? thu?c nh?t ??nh, ch?ng h?n nh? thu?c gi?m ?au ho?c thu?c lm cho t? cung co bp m?nh h?n. ? Qu v? c bi?n ch?ng [place]? ho?c chuy?n d?.  Theo di c th? ???c th?c hi?n b?ng cch:  ??t m?t ?ng nghe ??c bi?t ho?c thi?t b? theo di c?m tay ln b?ng c?a qu v? ?? ki?m tra nh?p tim c?a em b v c?m nh?n cc c?n co bp ? b?ng qu v?. Ph??ng php theo di ny khng lin t?c ghi l?i nh?p tim em b ho?c cc c?n co bp c?a qu v?.  ??t cc my theo di ln b?ng qu v? (my theo di bn ngoi) ?? ghi l?i nh?p tim c?a em b v t?n su?t v th?i gian ko di c?a cc c?n co bp. Qu v? c th? khng ph?i lc no c?ng mang my theo di bn ngoi.  ??t cc my theo di v trong t? cung qu v? (my theo di bn trong) ?? ghi l?i nh?p tim c?a em b v t?n su?t v th?i gian ko di v ?? m?nh c?a cc c?n co bp. ? Chuyn gia ch?m [place] s?c kh?e c th? s? d?ng cc my theo di bn trong n?u v? ? c?n thm thng tin v? ?? m?nh c?a cc c?n co bp c?a qu v? ho?c nh?p tim c?a em b. ? Cc my theo di bn trong ???c ??t b?ng cch lu?n m?t dy linh  ho?t m?nh qua m ??o v vo trong t? cung c?a qu v?. Ty thu?c vo lo?i my theo di, n c th? n?m trong t? cung ho?c ??u em b cho ??n khi sinh. ? Chuyn gia ch?m [place] s?c kh?e s? th?o lu?n v? nh?ng l?i ch v r?i ro c?a vi?c s? d?ng m?t my theo di bn trong v h?i xem qu v? c ??ng  khng tr??c khi ??a my theo di vo trong.  ?o t? xa. ?y l ki?u theo di lin t?c c th? ???c th?c hi?n b?ng cc my theo di bn ngoi ho?c bn trong. Thay v ph?i n?m trn gi??ng, qu v? c th? di chuy?n xung quan trong khi ?o t? xa. Hy h?i chuyn gia ch?m [place] s?c kh?e xem qu v? c th? l?a ch?n ?o t? xa khng.  Khm th?c th? Chuyn gia ch?m [place] s?c kh?e c th? khm th?c  th? cho qu v?. Vi?c ny c th? bao g?m:  Ki?m tra xem em b c n?m ? t? th?: ? ??u h??ng v? pha m ??o c?a qu v? (??u quay xu?ng). ?y l t? th? th??ng g?p nh?t. ? ??u h??ng v? pha ??nh t? cung c?a qu v? (??u quay ln hay ngi mng). N?u em b ? t? th? ngi mng, chuyn gia ch?m [place] s?c kh?e c th? th? [place] em b sang t? th? ??u quay xu?ng ?? qu v? c th? sinh qua ???ng m ??o. N?u khng c v? r?ng em b c th? ???c sinh qua ???ng m ??o, chuyn gia ch?m  s?c kh?e c th? khuy?n ngh? ph?u thu?t ?? sinh b. Trong cc tr??ng h?p hi?m g?p, qu v? c th? sinh qua ???ng m ??o n?u em b ? t? th? ??u quay ln (sinh ngi mng). ? N?m ngang b?ng (ngang). Cc b n?m ngang b?ng khng th? sinh qua ???ng m ??o.  Ki?m tra c? t? cung c?a qu v? ?? xc ??nh: ? Xem c? t? cung c ?ang m?ng h?n (m? ?i) hay khng. ? Xem c? t? cung c ?ang m? ra (gin ra) hay khng. ? Em b ? di chuy?n su ??n m?c no vo ?ng ??.  Ba giai ?o?n chuy?n d? v sinh n? l g?  Chuy?n d? v sinh n? bnh th??ng ???c chia thnh ba giai ?o?n sau ?y: [place] ?o?n 1  Giai ?o?n 1 l giai ?o?n di nh?t c?a qu trnh chuy?n d? v n c th? ko di nhi?u gi? hay ngy. Giai ?o?n 1 bao g?m: ? Chuy?n d? s?m. ?y l khi cc c?n co bp c th? khng ??u ho?c ??u v nh?[place] th??ng, cc c?n co bp chuy?n d? s?m th??ng cch nhau h?n 10 pht. ? Chuy?n d? tch c?c. ?y l khi cc c?n co bp tr? nn lu h?n, ??u h?n, th??ng xuyn h?n v m?nh h?n. ? Giai ?o?n chuy?n ti?p. ?y l khi cc c?n co bp x?y ra r?t g?n nhau, r?t m?nh v c th? ko di lu h?n trong cc giai ?o?n chuy?n d? khc.  Cc c?n co bp th??ng c?m th?y nh?, khng th??ng xuyn v khng ??u vo lc ??u. Cc c?n co tr? nn m?nh h?n, th??ng xuyn h?n (kho?ng m?i 2-3 pht m?t l?n) v ??u h?n khi qu v? chuy?n t? giai ?o?n chuy?n d? s?m sang giai ?o?n chuy?n d? tch c?c v chuy?n ti?p.  Nhi?u ph? n? tr?i qua giai ?o?n 1 m?t cch t? nhin, nh?ng qu v?  c th? c?n tr? gip ?? ti?p t?c  qu trnh. N?u ?i?u ny x?y ra, chuyn gia ch?m [place] s?c kh?e c th? trao ??i v?i qu v? v? vi?c: ? R?ch ti ?i n?u ti ?i ch?a v?. ? Cho qu v? dng thu?c ?? lm cho cc c?n co bp m?nh h?n v th??ng xuyn h?n.  Giai ?o?n 1 k?t thc khi c? t? cung gin ra hon ton ??n 4 inch (10 cm) v hon ton m? ra. ?i?u ny x?y ra ? cu?i giai ?o?n chuy?n ti?p. Giai ?o?n 2  Khi c? t? cung gin v m? hon ton ??n 4 inch (10 cm), qu v? c th? b?t ??u c c?m gic mu?n ??y ra. Thng th??ng c? th? s? t? nhin ngh? ng?i tr??c khi c?m th?y mu?n ??y ra, ??c bi?t l n?u qu v? ? nh?n thu?c gy t ngoi mng c?ng ho?c m?t s? thu?c gi?m ?au khc. Th?i gian ngh? ng?i ny c th? ko di ??n 1-2 gi?, ty thu?c vo tr?i nghi?m chuy?n d? duy nh?t c?a qu v?.  Trong giai ?o?n 2, cc c?n co bp th??ng t ?au h?n, b?i v vi?c ??y ra gip gi?m ?au do co bp. Thay v ?au do co bp, qu v? c th? c?m th?y c?ng t?c v ?au rt, ??c bi?t l khi ph?n r?ng nh?t c?a ??u em b ?i qua c?a m ??o (c? m ??o).  Chuyn gia ch?m [place] s?c kh?e s? theo di ch?t ch? qu trnh ??y ra c?a qu v? v qu trnh em b ?i ra thng qua m ??o trong giai ?o?n 2.  Chuyn gia ch?m [place] s?c kh?e c th? xoa bp khu v?c da gi?a c?a m ??o v h?u mn (vng ?y ch?u) ho?c ?n nh? vo vng ?y ch?u c?a qu v?. Vi?c ny gip ?y ch?u gin ra khi ??u em b b?t ??u ra ??n c? m ??o, c th? gip ng?n tnh tr?ng rch vng ?y ch?u. ? Trong m?t s? tr??ng h?p, m?t v?t m? c th? ???c r?ch ? vng ?y ch?u (th? thu?t c?t t?ng sinh mn) ?? cho php em b chui ra qua l? m ??o. Th? thu?t c?t t?ng sinh mn gip lm cho l? m ??o r?ng h?n nh?m t?o thm ch? cho em b chui qua.  Vi?c r?t quan tr?ng l th? v t?p trung ?? chuyn gia ch?m [place] s?c kh?e c th? ki?m sot qu trnh ?i ra c?a ??u em b. Chuyn gia ch?m [place] s?c kh?e c th? cho qu v? gi?m c??ng ?? ??y, nh?m gip ng?n ng?a rch vng ?y ch?u.  Sau khi ??u em b ra ngoi, hai vai v ph?n cn l?i c?a c? th? th??ng s? ?i ra  r?t nhanh m khng c kh kh?n g.  Khi em b ???c sinh ra, dy r?n c th? ???c c?t ngay l?p t?c ho?c c th? tr hon 1-2 pht, ty thu?c vo s?c kh?e c?a b. ?i?u ny c th? khc nhau gi?a cc chuyn gia ch?m [place] s?c kh?e, b?nh vi?n v trung tm h? sinh.  N?u qu v? v em b ??u ?? kh?e, b c th? ???c ??t ln ng?c ho?c b?ng qu v? ?? duy tr thn nhi?t c?a b v gip hai ng??i k?t n?i v?i nhau. M?t s? b m? v em b b?t ??u cho b vo lc ny. Nhm ch?m [place] s?c kh?e c?a qu v? s? lau kh em b v gip gi? ?m cho b trong  su?t th?i gian ny.  Em b c th? c?n ???c ch?m [place] ngay n?u b: ? C d?u hi?u ki?t s?c trong qu trnh chuy?n d?. ? C m?t tnh tr?ng b?nh l. ? ???c sinh ra qu s?m (sinh non). ? ? ??i ti?n tr??c khi sinh (phn su). ? C cc d?u hi?u kh chuy?n t? vi?c n?m trong t? cung sang n?m ngoi t? cung. N?u qu v? d? ??nh cho con b, nhm ch?m [place] s?c kh?e s? gip qu v? b?t ??u cho b b. Giai ?o?n 3  Giai ?o?n ba c?a qu trnh chuy?n d? b?t ??u ngay sau khi qu v? sinh em b v k?t thc sau khi qu v? ??y nhau [place] ra. Nhau thai l m?t c? quan pht tri?n trong su?t [place] k? ?? cung c?p  xi v ch?t dinh d??ng cho em b trong t? cung.  ??y nhau [place] ra c th? c?n ph?i ??y v qu v? c th? c cc c?n co bp nh?[place] Grist con b c th? kch thch cc c? co bp nh?m gip qu v? ??y nhau [place] ra.  Sau khi nhau thai ???c ??y ra, t? cung ph?i bp l?i (co l?i) v tr? nn r?n ch?c. ?i?u ny gip ng?ng ch?y mu trong t? cung. ?? gip t? cung co l?i v ki?m sot ch?y mu, chuyn gia ch?m [place] s?c kh?e c th?: ? Cho qu v? dng thu?c b?ng cch tim, truy?n qua ???ng truy?n t?nh m?ch, u?ng ho?c ??t ? tr?c trng (qua tr?c trng). ? [place] bp b?ng c?a qu v? ho?c ti?n hnh khm m ??o ?? lo?i b? b?t k? c?c mu ?ng no cn l?i trong t? cung c?a qu v?. ? Rt h?t n??c ti?u trong bng quang c?a qu v? b?ng cch ??t m?t ?ng m?m, m?nh (?ng thng) vo bng quang c?a qu v?. ? Khuy?n khch qu v? cho con  b. Sau khi sinh xong, qu v? v em b s? ???c theo di ch?t ch? ?? ??m b?o r?ng c? hai ??u kh?e m?nh cho ??n khi s?n sng v? nh. Nhm ch?m [place] s?c kh?e s? h??ng d?n qu v? cch ch?m [place] b?n thn v em b. Thng tin ny khng nh?m m?c ?ch thay th? cho l?i khuyn m chuyn gia ch?m [place] s?c kh?e ni v?i qu v?. Hy b?o ??m qu v? ph?i th?o lu?n b?t k? v?n ?? g m qu v? c v?i chuyn gia ch?m  s?c kh?e c?a qu v?. Document Released: 10/21/2015 Document Revised: 01/11/2017 Document Reviewed: 10/09/2015 [employer] Interactive Patient Education  2018 [employer].

## 2018-06-03 NOTE — Progress Notes (Signed)
   PRENATAL VISIT NOTE  Subjective:  Jeffrie Dedra SkeensKa is a 27 y.o. G2P1001 at 9578w1d being seen today for ongoing prenatal care.  She is currently monitored for the following issues for this low-risk pregnancy and has Alpha thalassemia silent carrier; Supervision of other normal pregnancy, antepartum; Language barrier affecting health care; History of tuberculosis; Previous cesarean section; and Late prenatal care affecting pregnancy on their problem list.  Patient reports no complaints.  Contractions: Not present. Vag. Bleeding: None.  Movement: Present. Denies leaking of fluid.   The following portions of the patient's history were reviewed and updated as appropriate: allergies, current medications, past family history, past medical history, past social history, past surgical history and problem list. Problem list updated.  Objective:   Vitals:   06/03/18 1037  BP: 105/71  Pulse: 84  Weight: 76.5 kg    Fetal Status: Fetal Heart Rate (bpm): 138 Fundal Height: 36 cm Movement: Present     General:  Alert, oriented and cooperative. Patient is in no acute distress.  Skin: Skin is warm and dry. No rash noted.   Cardiovascular: Normal heart rate noted  Respiratory: Normal respiratory effort, no problems with respiration noted  Abdomen: Soft, gravid, appropriate for gestational age.  Pain/Pressure: Present     Pelvic: Cervical exam performed Dilation: 1 Effacement (%): 50 Station: -2  Extremities: Normal range of motion.  Edema: Trace  Mental Status: Normal mood and affect. Normal behavior. Normal judgment and thought content.   Assessment and Plan:  Pregnancy: G2P1001 at 8578w1d  1. Previous cesarean section --Desires TOLAC, consent in chart  2. Supervision of other normal pregnancy, antepartum --Anticipatory guidance about next visits/weeks of pregnancy given. --Baby LOA by [facility] today, cervical change since 36 week visit at 1/50/-2, indicating readiness for labor, no signs of active labor.  Reviewed signs of labor/reasons to come to hospital.  Term labor symptoms and general obstetric precautions including but not limited to vaginal bleeding, contractions, leaking of fluid and fetal movement were reviewed in detail with the patient. Please refer to After Visit Summary for other counseling recommendations.  Return in about 1 week (around 06/10/2018).  No future appointments.  Sharen CounterLisa Leftwich-Kirby, CNM

## 2018-06-10 ENCOUNTER — Ambulatory Visit (INDEPENDENT_AMBULATORY_CARE_PROVIDER_SITE_OTHER): Payer: Medicaid Other | Admitting: Nurse Practitioner

## 2018-06-10 VITALS — BP 107/69 | HR 86 | Wt 172.7 lb

## 2018-06-10 DIAGNOSIS — Z3480 Encounter for supervision of other normal pregnancy, unspecified trimester: Secondary | ICD-10-CM

## 2018-06-10 DIAGNOSIS — Z789 Other specified health status: Secondary | ICD-10-CM

## 2018-06-10 DIAGNOSIS — Z98891 History of uterine scar from previous surgery: Secondary | ICD-10-CM

## 2018-06-10 DIAGNOSIS — Z3483 Encounter for supervision of other normal pregnancy, third trimester: Secondary | ICD-10-CM

## 2018-06-10 MED ORDER — VITAFOL ULTRA 29-0.6-0.4-200 MG PO CAPS: 1.0000 | ORAL_CAPSULE | Freq: Every day | ORAL | 11 refills | Status: AC

## 2018-06-10 NOTE — Progress Notes (Signed)
    Subjective:  Jillian Browning is a 27 y.o. G2P1001 at [redacted]w[redacted]d being seen today for ongoing prenatal care.  She is currently monitored for the following issues for this low-risk pregnancy and has Alpha thalassemia silent carrier; Supervision of other normal pregnancy, antepartum; Language barrier affecting health care; History of tuberculosis; Previous cesarean section; and Late prenatal care affecting pregnancy on their problem list.  Patient reports very few contractions.  More swelling in feet..  Contractions: Not present. Vag. Bleeding: None.  Movement: Present. Denies leaking of fluid.   The following portions of the patient's history were reviewed and updated as appropriate: allergies, current medications, past family history, past medical history, past social history, past surgical history and problem list. Problem list updated.  Objective:   Vitals:   06/10/18 1146  BP: 107/69  Pulse: 86  Weight: 172 lb 11.2 oz (78.3 kg)    Fetal Status: Fetal Heart Rate (bpm): 147 Fundal Height: 37 cm Movement: Present     General:  Alert, oriented and cooperative. Patient is in no acute distress.  Skin: Skin is warm and dry. No rash noted.   Cardiovascular: Normal heart rate noted  Respiratory: Normal respiratory effort, no problems with respiration noted  Abdomen: Soft, gravid, appropriate for gestational age. Pain/Pressure: Absent     Pelvic:  Cervical exam deferred        Extremities: Normal range of motion.  Edema: Trace  Even with more swelling from last visit, edema is minimal, not even 1+  Mental Status: Normal mood and affect. Normal behavior. Normal judgment and thought content.   Urinalysis:      Assessment and Plan:  Pregnancy: G2P1001 at [redacted]w[redacted]d  1. Supervision of other normal pregnancy, antepartum Doing well but very few contractions - feeling like the baby is lower Will schedule for induction  2. Previous cesarean section Plans VBAC and consent in chart  3. Language barrier  affecting health care Video interpreter used for the entire visit.  Term labor symptoms and general obstetric precautions including but not limited to vaginal bleeding, contractions, leaking of fluid and fetal movement were reviewed in detail with the patient. Please refer to After Visit Summary for other counseling recommendations.  Return in about 1 week (around 06/17/2018).  Nolene Bernheim, RN, MSN, NP-BC Nurse Practitioner, [facility] for [employer], [facility] 06/10/2018 12:01 PM

## 2018-06-10 NOTE — Addendum Note (Signed)
Addended by: Currie Paris on: 06/10/2018 12:19 PM   Modules accepted: Orders, SmartSet

## 2018-06-12 ENCOUNTER — Telehealth (HOSPITAL_COMMUNITY): Payer: Self-pay | Admitting: *Deleted

## 2018-06-12 NOTE — Telephone Encounter (Signed)
Preadmission screen Interpreter number [phone]

## 2018-06-14 ENCOUNTER — Inpatient Hospital Stay (HOSPITAL_COMMUNITY): Payer: Medicaid Other | Admitting: Anesthesiology

## 2018-06-14 ENCOUNTER — Inpatient Hospital Stay (HOSPITAL_COMMUNITY)
Admission: AD | Admit: 2018-06-14 | Discharge: 2018-06-16 | DRG: 807 | Disposition: A | Discharge status: Home / Self Care | Payer: Medicaid Other | Attending: Obstetrics and Gynecology | Admitting: Obstetrics and Gynecology

## 2018-06-14 ENCOUNTER — Encounter (HOSPITAL_COMMUNITY): Payer: Self-pay

## 2018-06-14 ENCOUNTER — Other Ambulatory Visit: Payer: Self-pay

## 2018-06-14 DIAGNOSIS — Z3480 Encounter for supervision of other normal pregnancy, unspecified trimester: Secondary | ICD-10-CM

## 2018-06-14 DIAGNOSIS — D563 Thalassemia minor: Secondary | ICD-10-CM | POA: Diagnosis present

## 2018-06-14 DIAGNOSIS — Z3A39 39 weeks gestation of pregnancy: Secondary | ICD-10-CM

## 2018-06-14 DIAGNOSIS — O34219 Maternal care for unspecified type scar from previous cesarean delivery: Principal | ICD-10-CM | POA: Diagnosis present

## 2018-06-14 DIAGNOSIS — Z3483 Encounter for supervision of other normal pregnancy, third trimester: Secondary | ICD-10-CM | POA: Diagnosis present

## 2018-06-14 LAB — CBC
HCT: 39 % (ref 36.0–46.0)
Hemoglobin: 13.2 g/dL (ref 12.0–15.0)
MCH: 26.2 pg (ref 26.0–34.0)
MCHC: 33.8 g/dL (ref 30.0–36.0)
MCV: 77.4 fL — ABNORMAL LOW (ref 78.0–100.0)
PLATELETS: 171 10*3/uL (ref 150–400)
RBC: 5.04 MIL/uL (ref 3.87–5.11)
RDW: 14.6 % (ref 11.5–15.5)
WBC: 14.7 10*3/uL — ABNORMAL HIGH (ref 4.0–10.5)

## 2018-06-14 LAB — ABO/RH: ABO/RH(D): B POS

## 2018-06-14 LAB — TYPE AND SCREEN
ABO/RH(D): B POS
Antibody Screen: NEGATIVE

## 2018-06-14 MED ORDER — IBUPROFEN 600 MG PO TABS
600.0000 mg | ORAL_TABLET | Freq: Four times a day (QID) | ORAL | Status: DC
  Administered 2018-06-14 – 2018-06-16 (×7): 600 mg via ORAL
  Filled 2018-06-14 (×5): qty 1

## 2018-06-14 MED ORDER — TETANUS-DIPHTH-ACELL PERTUSSIS 5-2.5-18.5 LF-MCG/0.5 IM SUSP: 0.5000 mL | Freq: Once | INTRAMUSCULAR | Status: DC

## 2018-06-14 MED ORDER — OXYCODONE-ACETAMINOPHEN 5-325 MG PO TABS: 2.0000 | ORAL_TABLET | ORAL | Status: DC | PRN

## 2018-06-14 MED ORDER — SIMETHICONE 80 MG PO CHEW
80.0000 mg | CHEWABLE_TABLET | ORAL | Status: DC | PRN
  Administered 2018-06-15: 80 mg via ORAL

## 2018-06-14 MED ORDER — BENZOCAINE-MENTHOL 20-0.5 % EX AERO
1.0000 "application " | INHALATION_SPRAY | CUTANEOUS | Status: DC | PRN
  Administered 2018-06-14: 1 via TOPICAL
  Filled 2018-06-14: qty 56

## 2018-06-14 MED ORDER — LACTATED RINGERS IV SOLN
INTRAVENOUS | Status: DC
  Administered 2018-06-14: 16:00:00 via INTRAVENOUS

## 2018-06-14 MED ORDER — SOD CITRATE-CITRIC ACID 500-334 MG/5ML PO SOLN: 30.0000 mL | ORAL | Status: DC | PRN

## 2018-06-14 MED ORDER — ONDANSETRON HCL 4 MG/2ML IJ SOLN: 4.0000 mg | Freq: Four times a day (QID) | INTRAMUSCULAR | Status: DC | PRN

## 2018-06-14 MED ORDER — LACTATED RINGERS IV SOLN: 500.0000 mL | INTRAVENOUS | Status: DC | PRN

## 2018-06-14 MED ORDER — ONDANSETRON HCL 4 MG/2ML IJ SOLN: 4.0000 mg | INTRAMUSCULAR | Status: DC | PRN

## 2018-06-14 MED ORDER — OXYTOCIN 40 UNITS IN LACTATED RINGERS INFUSION - SIMPLE MED
2.5000 [IU]/h | INTRAVENOUS | Status: DC
  Filled 2018-06-14: qty 1000

## 2018-06-14 MED ORDER — OXYCODONE-ACETAMINOPHEN 5-325 MG PO TABS: 1.0000 | ORAL_TABLET | ORAL | Status: DC | PRN

## 2018-06-14 MED ORDER — FENTANYL CITRATE (PF) 100 MCG/2ML IJ SOLN
100.0000 ug | Freq: Once | INTRAMUSCULAR | Status: AC
  Administered 2018-06-14: 100 ug via INTRAVENOUS
  Filled 2018-06-14: qty 2

## 2018-06-14 MED ORDER — PHENYLEPHRINE 40 MCG/ML (10ML) SYRINGE FOR IV PUSH (FOR BLOOD PRESSURE SUPPORT)
80.0000 ug | PREFILLED_SYRINGE | INTRAVENOUS | Status: DC | PRN
  Filled 2018-06-14: qty 5
  Filled 2018-06-14: qty 10

## 2018-06-14 MED ORDER — LIDOCAINE HCL (PF) 1 % IJ SOLN
INTRAMUSCULAR | Status: DC | PRN
  Administered 2018-06-14: 13 mL via EPIDURAL

## 2018-06-14 MED ORDER — ZOLPIDEM TARTRATE 5 MG PO TABS: 5.0000 mg | ORAL_TABLET | Freq: Every evening | ORAL | Status: DC | PRN

## 2018-06-14 MED ORDER — PHENYLEPHRINE 40 MCG/ML (10ML) SYRINGE FOR IV PUSH (FOR BLOOD PRESSURE SUPPORT)
80.0000 ug | PREFILLED_SYRINGE | INTRAVENOUS | Status: DC | PRN
  Administered 2018-06-14 (×2): 400 ug via INTRAVENOUS
  Filled 2018-06-14: qty 5

## 2018-06-14 MED ORDER — FENTANYL 2.5 MCG/ML BUPIVACAINE 1/10 % EPIDURAL INFUSION (WH - ANES)
14.0000 mL/h | INTRAMUSCULAR | Status: DC | PRN
  Administered 2018-06-14: 14 mL/h via EPIDURAL
  Filled 2018-06-14: qty 100

## 2018-06-14 MED ORDER — EPHEDRINE 5 MG/ML INJ
10.0000 mg | INTRAVENOUS | Status: DC | PRN
  Filled 2018-06-14: qty 2

## 2018-06-14 MED ORDER — ONDANSETRON HCL 4 MG PO TABS: 4.0000 mg | ORAL_TABLET | ORAL | Status: DC | PRN

## 2018-06-14 MED ORDER — WITCH HAZEL-GLYCERIN EX PADS: 1.0000 "application " | MEDICATED_PAD | CUTANEOUS | Status: DC | PRN

## 2018-06-14 MED ORDER — LACTATED RINGERS IV SOLN: INTRAVENOUS | Status: DC

## 2018-06-14 MED ORDER — PRENATAL MULTIVITAMIN CH
1.0000 | ORAL_TABLET | Freq: Every day | ORAL | Status: DC
  Administered 2018-06-15 – 2018-06-16 (×2): 1 via ORAL
  Filled 2018-06-14: qty 1

## 2018-06-14 MED ORDER — OXYTOCIN BOLUS FROM INFUSION
500.0000 mL | Freq: Once | INTRAVENOUS | Status: AC
  Administered 2018-06-14: 500 mL via INTRAVENOUS

## 2018-06-14 MED ORDER — DIPHENHYDRAMINE HCL 25 MG PO CAPS: 25.0000 mg | ORAL_CAPSULE | Freq: Four times a day (QID) | ORAL | Status: DC | PRN

## 2018-06-14 MED ORDER — SENNOSIDES-DOCUSATE SODIUM 8.6-50 MG PO TABS
2.0000 | ORAL_TABLET | ORAL | Status: DC
  Administered 2018-06-14 – 2018-06-15 (×2): 2 via ORAL
  Filled 2018-06-14 (×2): qty 2

## 2018-06-14 MED ORDER — ACETAMINOPHEN 325 MG PO TABS: 650.0000 mg | ORAL_TABLET | ORAL | Status: DC | PRN

## 2018-06-14 MED ORDER — DIPHENHYDRAMINE HCL 50 MG/ML IJ SOLN: 12.5000 mg | INTRAMUSCULAR | Status: DC | PRN

## 2018-06-14 MED ORDER — LIDOCAINE HCL (PF) 1 % IJ SOLN
30.0000 mL | INTRAMUSCULAR | Status: DC | PRN
  Administered 2018-06-14: 30 mL via SUBCUTANEOUS
  Filled 2018-06-14: qty 30

## 2018-06-14 MED ORDER — DIBUCAINE 1 % RE OINT: 1.0000 "application " | TOPICAL_OINTMENT | RECTAL | Status: DC | PRN

## 2018-06-14 MED ORDER — COCONUT OIL OIL: 1.0000 "application " | TOPICAL_OIL | Status: DC | PRN

## 2018-06-14 MED ORDER — LACTATED RINGERS IV SOLN
500.0000 mL | Freq: Once | INTRAVENOUS | Status: AC
  Administered 2018-06-14: 500 mL via INTRAVENOUS

## 2018-06-14 NOTE — Anesthesia Preprocedure Evaluation (Signed)

## 2018-06-14 NOTE — MAU Provider Note (Signed)
Patient Jillian Browning is a 27 y.o. G2P1001 At [redacted]w[redacted]d here with complaints of contractions and  Vaginal bleeding. She denies decreased fetal movements, leaking of fluid, or other ob-gyn complaints.  History     CSN: [id number]  Arrival date and time: 06/14/18 9628   First Provider Initiated Contact with Patient 06/14/18 1135      Chief Complaint  Patient presents with  . Vaginal Bleeding   Vaginal Bleeding  The patient's primary symptoms include vaginal bleeding. This is a new problem. The problem occurs rarely. The problem has been resolved. Associated symptoms include abdominal pain. The vaginal discharge was bloody. The vaginal bleeding is spotting. She has not been passing clots. She has not been passing tissue. Nothing aggravates the symptoms.   She said she had a gush of blood at 9 am. She had to change her underwear and then she came straight to the hospital.   She also endorses intermittent contractions since last night; she rates tham a 2/10.  OB History    Gravida  2   Para  1   Term  1   Preterm      AB      Living  1     SAB      TAB      Ectopic      Multiple      Live Births  1           Past Medical History:  Diagnosis Date  . Tuberculosis 2016    Past Surgical History:  Procedure Laterality Date  . CESAREAN SECTION      History reviewed. No pertinent family history.  Social History   Tobacco Use  . Smoking status: Never Smoker  . Smokeless tobacco: Never Used  Substance Use Topics  . Alcohol use: Never    Frequency: Never  . Drug use: Never    Allergies: No Known Allergies  Medications Prior to Admission  Medication Sig Dispense Refill Last Dose  . Prenat-Fe Poly-Methfol-FA-DHA (VITAFOL ULTRA) 29-0.6-0.4-200 MG CAPS Take 1 capsule by mouth daily. 30 capsule 11     Review of Systems  HENT: Negative.   Respiratory: Negative.   Gastrointestinal: Positive for abdominal pain.  Genitourinary: Positive for vaginal bleeding.   Neurological: Negative.   Hematological: Negative.   Psychiatric/Behavioral: Negative.    Physical Exam   Blood pressure 125/82, pulse 97, temperature 97.7 F (36.5 C), temperature source Oral, resp. rate 16, weight 78.1 kg, last menstrual period 09/09/2017, SpO2 98 %.  Physical Exam  Constitutional: She is oriented to person, place, and time. She appears well-developed.  HENT:  Head: Normocephalic.  Eyes: Pupils are equal, round, and reactive to light.  Neck: Normal range of motion.  Respiratory: Effort normal.  GI: Soft.  Genitourinary:  Genitourinary Comments: Normal external female genitalia; blood-tinged mucous in cervix but no active bleeding. Cervixi is Research scientist (life sciences). Cephalus is -3 station.   Neurological: She is alert and oriented to person, place, and time.  Skin: Skin is warm and dry.  Psychiatric: She has a normal mood and affect.    MAU Course  Procedures  MDM -bleeding episode resolved; patient will walk for an hour and re-check. Low suspicions for abruption due to mild contractions, small bleed which is now resolved. At this point, consider patient a labor check.  Cat 1 tracing Patient still desires TOLAC.  Assessment and Plan   Patient care endorsed to [facility] at 1300.   Luna Kitchens  Cay Meanor Syrena Burges 06/14/2018, 11:35  AM  

## 2018-06-14 NOTE — Anesthesia Procedure Notes (Signed)
Epidural Patient location during procedure: OB Start time: 06/14/2018 6:42 PM End time: 06/14/2018 7:04 PM  Staffing Anesthesiologist: Lowella Curb, MD Performed: anesthesiologist   Preanesthetic Checklist Completed: patient identified, site marked, surgical consent, pre-op evaluation, timeout performed, IV checked, risks and benefits discussed and monitors and equipment checked  Epidural Patient position: sitting Prep: ChloraPrep Patient monitoring: heart rate, cardiac monitor, continuous pulse ox and blood pressure Approach: midline Location: L2-L3 Injection technique: LOR saline  Needle:  Needle type: Tuohy  Needle gauge: 17 G Needle length: 9 cm Needle insertion depth: 4 cm Catheter type: closed end flexible Catheter size: 20 Guage Catheter at skin depth: 8 cm Test dose: negative  Assessment Events: blood not aspirated, injection not painful, no injection resistance, negative IV test and no paresthesia  Additional Notes Reason for block:procedure for pain

## 2018-06-14 NOTE — MAU Note (Signed)
Pt. States she woke up with pain in lower back that she thinks were ctx.  Pt. Also noticed bright red vag. Bleeding that soaked through her underwear around 0800.  Pt. Denies any other vaginal dc.  Last baby was born in [place].

## 2018-06-14 NOTE — Progress Notes (Signed)
Pt requesting epidural--interpretor used to consent patient for epidural-pt verbalized  Understanding of benefits and risk-pt wishes to proceed.

## 2018-06-14 NOTE — H&P (Signed)
Jillian Browning is a 27 y.o. female G2P1001 @ 39.5 wks  presenting for contractions since awaking this morning and vag bleeding. GBS neg. patient is previous cesarean section that desires trial of labor.Alpha thalassemia silent carrier. OB History    Gravida  2   Para  1   Term  1   Preterm      AB      Living  1     SAB      TAB      Ectopic      Multiple      Live Births  1          Past Medical History:  Diagnosis Date  . Tuberculosis 2016   Past Surgical History:  Procedure Laterality Date  . CESAREAN SECTION     Family History: family history is not on file. Social History:  reports that she has never smoked. She has never used smokeless tobacco. She reports that she does not drink alcohol or use drugs.     Maternal Diabetes: No Genetic Screening: Normal Maternal Ultrasounds/Referrals: Normal Fetal Ultrasounds or other Referrals:  None Maternal Substance Abuse:  No Significant Maternal Medications:  None Significant Maternal Lab Results:  None Other Comments:  None  Review of Systems  Constitutional: Negative.   HENT: Negative.   Eyes: Negative.   Respiratory: Negative.   Cardiovascular: Negative.   Gastrointestinal: Positive for abdominal pain.  Genitourinary: Negative.   Musculoskeletal: Negative.   Skin: Negative.   Neurological: Negative.   Endo/Heme/Allergies: Negative.   Psychiatric/Behavioral: Negative.    Maternal Medical History:  Reason for admission: Contractions.   Contractions: Onset was 6-12 hours ago.   Frequency: regular.   Perceived severity is mild.    Fetal activity: Perceived fetal activity is normal.   Last perceived fetal movement was within the past hour.    Prenatal Complications - Diabetes: none.    Dilation: 3.5 Effacement (%): 60, 70 Station: -2 Exam by:: Marvel Plan RN  Blood pressure 125/82, pulse 97, temperature 97.7 F (36.5 C), temperature source Oral, resp. rate 16, weight 78.1 kg, last menstrual  period 09/09/2017, SpO2 98 %. Maternal Exam:  Uterine Assessment: Contraction strength is moderate.  Contraction frequency is regular.   Abdomen: Patient reports no abdominal tenderness. Surgical scars: low transverse.   Fetal presentation: vertex  Introitus: Normal vulva. Normal vagina.  Amniotic fluid character: not assessed.  Pelvis: adequate for delivery.   Cervix: Cervix evaluated by digital exam.     Fetal Exam Fetal Monitor Review: Mode: ultrasound.   Baseline rate: 140's.  Variability: moderate (6-25 bpm).   Pattern: accelerations present and no decelerations.    Fetal State Assessment: Category I - tracings are normal.     Physical Exam  Constitutional: She is oriented to person, place, and time. She appears well-developed and well-nourished.  HENT:  Head: Normocephalic and atraumatic.  Eyes: Pupils are equal, round, and reactive to light.  Neck: Normal range of motion.  Cardiovascular: Normal rate, regular rhythm, normal heart sounds and intact distal pulses.  Respiratory: Effort normal and breath sounds normal.  GI: Soft. Bowel sounds are normal.  Genitourinary: Vagina normal and uterus normal.  Musculoskeletal: Normal range of motion.  Neurological: She is alert and oriented to person, place, and time. She has normal reflexes.  Skin: Skin is warm and dry.  Psychiatric: She has a normal mood and affect. Her behavior is normal. Judgment and thought content normal.    Prenatal labs: ABO, Rh: B/Positive/-- (  04/10 0855) Antibody: Negative (04/10 0855) Rubella: 31.90 (04/10 0855) RPR: Non Reactive (06/25 0837)  HBsAg: Negative (04/10 0855)  HIV: Non Reactive (06/25 0837)  GBS:     Assessment/Plan: TOLAC 3-4/70/-1 GBS neg admit   Jillian Browning 06/14/2018, 1:10 PM

## 2018-06-14 NOTE — Progress Notes (Signed)
Patient ID: Jillian Browning, female   DOB: 11/29/1990, 27 y.o.   MRN: [id number] Indication for operative vaginal delivery: Non reassuring FHT's  Risks of vacuum assistance were discussed in detail, including but not limited to, bleeding, infection, damage to maternal tissues, fetal cephalohematoma, inability to effect vaginal delivery of the head or shoulder dystocia that cannot be resolved by established maneuvers and need for emergency cesarean section.  Patient gave verbal consent. Interrupter services used.   Patient was examined and found to be fully dilated with fetal station of +3. The soft vacuum soft cup was positioned over the sagittal suture 3 cm anterior to posterior fontanelle.  Pressure was then increased to 500 mmHg, and the patient was instructed to push.  Pulling was administered along the pelvic curve.  1 pull was administered during 1 push, no  popoffs.  The infant was then delivered atraumatically, noted to be a viable female infant. Ant shoulder and remainder of the infant's body was delivered without problems. Body cord noted and easily reduced. Infant with good cry and color. Cord clamped after 1 minute delay and infant was passed off to NICU attendants. Apgars and weight as recorded. Cord blood collected. 3 vessel cord intact placenta by gentle traction. Second degree laceration repaired in layered fashion. Small peri clitorial laceration noted but not bleeding and thus not repaired.  [id number], epidural anesthesia.   Sponge, instrument and needle counts were correct x2.  The patient and baby were stable after delivery and remained in couplet care   Nettie Elm, MD, FACOG Attending Obstetrician & Gynecologist Faculty Practice, [facility]

## 2018-06-14 NOTE — Progress Notes (Signed)
RN utilizing interpreter services to explain initial postpartum care. Pt verbalizes understanding and has no questions for [employer] [phone]

## 2018-06-15 LAB — CBC
HCT: 30.7 % — ABNORMAL LOW (ref 36.0–46.0)
HEMOGLOBIN: 10.4 g/dL — AB (ref 12.0–15.0)
MCH: 25.9 pg — AB (ref 26.0–34.0)
MCHC: 33.9 g/dL (ref 30.0–36.0)
MCV: 76.6 fL — AB (ref 78.0–100.0)
Platelets: 165 10*3/uL (ref 150–400)
RBC: 4.01 MIL/uL (ref 3.87–5.11)
RDW: 14.6 % (ref 11.5–15.5)
WBC: 17.6 10*3/uL — ABNORMAL HIGH (ref 4.0–10.5)

## 2018-06-15 LAB — RPR: RPR: NONREACTIVE

## 2018-06-15 NOTE — Anesthesia Postprocedure Evaluation (Signed)
Anesthesia Post Note  Patient: Jillian Browning  Procedure(s) Performed: AN AD HOC LABOR EPIDURAL     Patient location during evaluation: Mother Baby Anesthesia Type: Epidural Level of consciousness: awake Pain management: satisfactory to patient Vital Signs Assessment: post-procedure vital signs reviewed and stable Respiratory status: spontaneous breathing Cardiovascular status: stable Anesthetic complications: no    Last Vitals:  Vitals:   06/15/18 0200 06/15/18 0517  BP: 91/60 96/60  Pulse: 90 81  Resp: 16 18  Temp: 36.7 C 36.7 C  SpO2:      Last Pain:  Vitals:   06/15/18 0517  TempSrc: Oral  PainSc: 0-No pain   Pain Goal: Patients Stated Pain Goal: 0 (06/14/18 1724)               Cephus Shelling

## 2018-06-15 NOTE — Progress Notes (Addendum)
Post Partum Day 1  Subjective:  Jillian Browning is a 27 y.o. G2P1001 [redacted]w[redacted]d s/p VAVD.  No acute events overnight.  Pt denies problems with ambulating, voiding or po intake.  She denies nausea or vomiting.  Pain is well controlled.  She has not had flatus. She has not had bowel movement.  Lochia Moderate.  Plan for birth control is no method.  Method of Feeding: Bottle  Denies HA, dizziness, SOB, CP, NVD Objective: BP 96/60 (BP Location: Left Arm)   Pulse 81   Temp 98 F (36.7 C) (Oral)   Resp 18   Wt 78.1 kg   LMP 09/09/2017 (Exact Date)   SpO2 98%   BMI 30.49 kg/m   Physical Exam:  General: alert, cooperative and no distress Lochia:normal flow  Chest: CTAB Heart: RRR no m/r/g Abdomen: +BS, soft, nontender, fundus firm at/below umbilicus Uterine Fundus: firm, non-tender DVT Evaluation: No evidence of DVT seen on physical exam. Extremities: trace edema  Recent Labs    06/14/18 1508 06/15/18 0558  HGB 13.2 10.4*  HCT 39.0 30.7*    Assessment/Plan:  ASSESSMENT: Jillian Browning is a 27 y.o. G2P1001 [redacted]w[redacted]d ppd # s/p VAVD doing well.  -- asymptomatic for post partum anemia -- continue to monitor for signs of anemia   Plan for discharge tomorrow   LOS: 1 day   Celedonio Savage 06/15/2018, 9:18 AM

## 2018-06-15 NOTE — Progress Notes (Signed)
Post Partum Day 1 Subjective: no complaints, up ad lib, voiding and tolerating PO  Objective: Blood pressure 106/78, pulse 88, temperature 97.9 F (36.6 C), temperature source Oral, resp. rate 16, weight 78.1 kg, last menstrual period 09/09/2017, SpO2 98 %.  Physical Exam:  General: alert, cooperative, appears stated age and no distress Lochia: appropriate Uterine Fundus: firm Incision: healing well DVT Evaluation: No evidence of DVT seen on physical exam.  Recent Labs    06/14/18 1508 06/15/18 0558  HGB 13.2 10.4*  HCT 39.0 30.7*    Assessment/Plan: Plan for discharge tomorrow   LOS: 1 day   Jillian Browning 06/15/2018, 10:11 AM

## 2018-06-16 ENCOUNTER — Encounter: Payer: Self-pay | Admitting: *Deleted

## 2018-06-16 NOTE — Discharge Instructions (Signed)
Sinh con qua ????ng m ?a?o, Ch?m so?c sau sinh Vaginal Delivery, Care After Hy tham kh?o t? thng tin ny trong vi tu?n t?i. Nh?ng h??ng d?n ny cung c?p cho qu v? thng tin v? cch ch?m [place] b?n thn sau khi sinh con qua ???ng m ?a?o. Chuyn gia ch?m [place] s?c kh?e c?ng c th? c h??ng d?n c? th? h?n cho qu v?. Vi?c ?i?u tr? c?a qu v? ? ???c ln k? ho?ch theo th?c hnh y khoa hi?n t?i, nh?ng v?n ?? ?i khi v?n x?y ra. Hy g?i cho chuyn gia ch?m [place] s?c kh?e n?u qu v? c b?t k? v?n ?? ho?c th?c m?c no. Ti c th? d? ki?n ?i?u g s? x?y ra sau khi lm th? thu?t? Sau khi sinh con qua ????ng m ?a?o, thng th???ng se? co?:  Cha?y ma?u m?t chu?t ?? m ?a?o.  ?au nh??c ?? bu?ng, m ?a?o va? vu?ng da gi??a c?a m ?a?o va? h?u mn (?a?y ch?u).  Co th??t vu?ng ch?u.  M?t m?i.  Tun th? nh?ng h??ng d?n ny ? nh: Thu?c  Ch? s? d?ng thu?c khng k ??n v thu?c k ??n theo ch? d?n c?a chuyn gia ch?m [place] s?c kh?e.  N?u qu v? ???c k thu?c khng sinh, hy dng thu?c theo ch? d?n c?a chuyn gia ch?m [place] s?c kh?e. Khng d??ng du?ng thu?c kha?ng sinh cho ??n khi h?t thu?c. Li xe   Khng li xe ho?c v?n hnh my mc h?ng n?ng trong khi dng thu?c gi?m ?au ???c k ??n.  Khng li xe trong vng 24 gi? n?u qu v? ? dng thu?c an th?n. L?i s?ng  Khng u?ng r??u. ?i?u na?y ??c bi?t quan tro?ng n?u quy? vi? cho con bu? b??ng s??a me? ho??c ?ang du?ng thu?c gia?m ?au.  Khng s? d?ng cc s?n ph?m thu?c l, bao g?m thu?c l d?ng ht, thu?c l d?ng nhai ho?c thu?c l ?i?n t?. N?u qu v? c?n gip ?? ?? cai thu?c, hy h?i chuyn gia ch?m [place] s?c kh?e. ?n v u?ng  U?ng i?t nh?t 8 ly m?i ly ta?m aox? n???c m?i nga?y tr?? khi chuyn gia ch?m so?c s??c kho?e ba?o quy? vi? khng la?m v?y. N?u quy? vi? ch?n nui con b??ng s??a me?, quy? vi? co? th? c?n u?ng nhi?u n???c h?n m??c na?y.  ?n ca?c th??c ph?m nhi?u ch?t x? m?i nga?y. Nh??ng th??c ph?m na?y co? th? gip ng?n ng?a  ho??c gia?m ta?o bo?n. Th??c ph?m nhi?u ch?t x? bao g?m: ? Ngu? c?c va? ba?nh mi? nguyn h?t. ? Ga?o l??t. ? ??u. ? Tri cy v rau c? t??i. Ho?t ??ng  Tr? l?i sinh ho?t bnh th??ng theo ch? d?n c?a chuyn gia ch?m [place] s?c kh?e. H?i chuyn gia ch?m [place] s?c kh?e v? cc ho?t ??ng no an ton cho qu v?.  Ngh? ng?i cng nhi?u cng t?t. C? g??ng nghi? ng?i va? ngu? ch??p m??t khi con quy? vi? ngu?Imagene Sheller nng b?t ky? v?t gi? n??ng h?n con quy? vi? ho??c qu 10 lb (4,5 kg) cho ??n khi chuyn gia ch?m so?c s??c kho?e no?i la?m v?y la? an toa?n.  No?i chuy?n v??i chuyn gia ch?m so?c s??c kho?e cu?a quy? vi? v? vi?c khi na?o quy? vi? co? th? quan h? ti?nh du?c. ?i?u na?y c th? phu? thu?c va?o: ? Nguy c? nhi?m tru?ng. ? T?c ?? li?n. ? C?m gic thoa?i ma?i va? mong mu?n quan h? ti?nh du?c. Ch?m [place] m ??o  N?u quy? vi?  c ???c lm th? thu?t c?t t?ng sinh mn ho??c bi? ra?ch m ?a?o, ha?y ki?m tra khu v??c ?o? m?i nga?y xem co? d?u hi?u nhi?m tru?ng khng. Ki?m tra xem c: ? T?y ??, s?ng n?, ho?c ?au nhi?u h?n. ? Nhi?u d?ch ho?c mu h?n. ? ?m. ? M? ho?c mi hi.  Khng s?? du?ng nu?t v? sinh ho??c thu?t r??a cho ??n khi chuyn gia ch?m so?c s??c kho?e no?i vi?c na?y la? an toa?n.  Nhi?n xem co? b?t ky? cu?c [place]?u na?o [place]? co? th? tri ra t?? m ?a?o khng. Nh??ng cu?c [place]?u na?y co? th? trng nh? nh??ng cu?c khi? h? ?o? th?m, nu ho??c ?en. H??ng d?n chung  Gi? cho vu?ng ?a?y ch?u s?ch v kh theo ch? d?n c?a chuyn gia ch?m [place] s?c kh?e.  M??c qu?n a?o r?ng, thoa?i [place]?i.  Lau t?? tr???c ra sau khi quy? vi? ?i v? sinh.  Hy h?i chuyn gia ch?m [place] s?c kh?e xem qu v? c th? t?m vi hoa sen hay t??m b?n khng. N?u quy? vi? ???c lm th? thu?t c?t t?ng sinh mn ho??c ra?ch t?ng sinh mn trong lu?c chuy?n d? v sinh con, chuyn gia ch?m so?c s??c kho?e co? th? no?i quy? vi? khng t??m b?n trong m?t khoa?ng th??i gian nh?t ?i?nh.  M??c a?o ng??c h?  tr?? cho vu? va? v??a v??n v??i quy? vi?.  N?u co? th?, ha?y nh?? ai giu?p quy? vi? vi?c nha? va? giu?p ch?m so?c con quy? vi? trong i?t nh?t va?i nga?y sau khi quy? vi? ra vi?n.  Tun th? t?t c? cc l?n khm theo di cho quy? vi? va? con quy? vi? theo ch? d?n c?a chuyn gia ch?m [place] s?c kh?e. ?i?u ny c vai tr quan tr?ng. Hy lin l?c v?i chuyn gia ch?m [place] s?c kh?e n?u:  Qu v? bi?: ? Khi? h? ?? m ?a?o [place]? co? mu?i hi. ? ?i ti?u kh kh?n. ? ?au khi ?i ti?u. ? ??t ng?t t?ng ho??c gia?m s? l?n ?a?i ti?n. ? ?o?, s?ng ho??c ?au nhi?u h?n quanh ch? lm th? thu?t c?t t?ng sinh mn ho?c rch m ??o. ? Nhi?u ch?t di?ch ho??c nhi?u [place]?u cha?y ra ? ch? lm th? thu?t c?t t?ng sinh mn ho?c ch? ra?ch m ?a?o h?n. ? Co? mu? ho??c mu?i hi toa?t ra t?? ch? lm th? thu?t c?t t?ng sinh mn ho?c ch? ra?ch m ?a?o. ? S?t. ? Pht ban. ? I?t quan tm ho??c AMR Corporationkhng quan tm ??n ca?c hoa?t ??ng [place]? quy? vi? t??ng thi?ch thu?. ? Ca?c cu ho?i v? ch?m so?c ba?n thn ho?c con quy? vi?.  Ca?m th?y ?m khi cha?m va?o ch? lm th? thu?t c?t t?ng sinh mn ho??c ch? ra?ch m ?a?o.  Ch? lm th? thu?t c?t t?ng sinh mn ho??c ch? ra?ch m ?a?o v?n bi? h?? ho?c khng co? ve? li?n la?i.  Vu? cu?a quy? vi? ?au, c??ng ho??c chuy?n sang mu ?o?Anselmo Rod.  Quy? vi? ca?m th?y bu?n ho??c lo l??ng b?t th???ng.  Qu v? c?m th?y bu?n nn ho?c qu v? nn.  Qu v? c cc c?c mu ?ng l?n tri ra t? m ??o. N?u quy? vi? co? m?t cu?c [place]?u ?ng tri ra t?? m ?a?o, ha?y gi?? la?i ?? cho chuyn gia ch?m so?c s??c kho?e cu?a quy? vi? xem. Khng xa? tri ca?c cu?c [place]?u ?ng xu?ng b?n c?u [place]? khng c ki?m tra c?a chuyn gia ch?m so?c s??c kho?e.  Qu v? ?i ti?u nhi?u h?n bnh th??ng.  Quy?  vi? b? chng m?t ho?c choa?ng va?ng.  Quy? vi? khng cho con bu? m?t cht no va? quy? vi? ch?a th?y kinh trong 12 H[address]?n sau khi sinh con.  Quy? vi? d??ng cho con bu? va? quy? vi? ch?a th?y kinh trong [address] [place]?n  sau khi ng??ng cho con bu?Cathie Hoops c?u tr? gip ngay l?p t?c n?u:  Qu v? bi?: ? ?au ma? khng h?t ho?c khng ??? sau khi du?ng thu?c. ? ?au ng?c. ? Kh th?. ? Nhi?n m?? ho?c nhi?n th?y ??m. ? Y? nghi? v? vi?c la?m t?n th??ng cho ba?n thn ho?c con quy? vi?.  Qu v? b? ?au b?ng ho?c ?au ? m?t trong Wynelle Fanny v? b? ?au ??u d? d?i.  Qu v? b? ng?t.  Quy? vi? bi? cha?y ma?u ?? m ?a?o nhi?u ??n m??c quy? vi? th?m ???t hai b?ng v? sinh trong m?t gi??. Thng tin ny khng nh?m m?c ?ch thay th? cho l?i khuyn m chuyn gia ch?m [place] s?c kh?e ni v?i qu v?. Hy b?o ??m qu v? ph?i th?o lu?n b?t k? v?n ?? g m qu v? c v?i chuyn gia ch?m [place] s?c kh?e c?a qu v?. Document Released: 09/24/2005 Document Revised: 01/11/2017 Document Reviewed: 10/09/2015 Elsevier Interactive Patient Education  2018 [employer].

## 2018-06-16 NOTE — Discharge Summary (Addendum)
OB Discharge Summary     Patient Name: Jillian Browning DOB: [birth year] MRN: [id number]  Date of admission: 06/14/2018 Delivering MD: Hermina Staggers   Date of discharge: 06/16/2018  Admitting diagnosis: 39.3 WKS BLEEDING Intrauterine pregnancy: 110w0d     Secondary diagnosis:  Active Problems:   Normal labor   Postpartum care following vaginal delivery  Additional problems: none     Discharge diagnosis: Term Pregnancy Delivered                                                                                                Post partum procedures:none  Augmentation: none  Complications: None  Hospital course:  Onset of Labor With Vaginal Delivery     27 y.o. yo G2P1001 at [redacted]w[redacted]d was admitted in Latent Labor on 06/14/2018. Patient had an uncomplicated labor course as follows:  Membrane Rupture Time/Date: 4:15 PM ,06/14/2018   Intrapartum Procedures: Episiotomy: None [1]                                         Lacerations:  Perineal [11];2nd degree [3]  Patient had a delivery of a Viable infant. 06/14/2018  Information for the patient's newborn:  Jillian, Freundlich Girl Browning [[id number]]  Delivery Method: VBAC    Pateint had an uncomplicated postpartum course.  She is ambulating, tolerating a regular diet, passing flatus, and urinating well. Patient is discharged home in stable condition on 06/16/18.   Physical exam  Vitals:   06/15/18 0959 06/15/18 1237 06/15/18 2110 06/16/18 0500  BP: 106/78 103/71 97/63 95/71   Pulse: 88 (!) 102 87 75  Resp: 16 18 18 18   Temp: 97.9 F (36.6 C) 98.2 F (36.8 C) 97.9 F (36.6 C) (!) 97.5 F (36.4 C)  TempSrc: Oral Oral    SpO2:   99% 98%  Weight: 78.1 kg     Height: 5\' 3"  (1.6 m)      General: alert, cooperative and no distress Lochia: appropriate Uterine Fundus: firm Incision: N/A DVT Evaluation: No evidence of DVT seen on physical exam. Labs: Lab Results  Component Value Date   WBC 17.6 (H) 06/15/2018   HGB 10.4 (L) 06/15/2018   HCT 30.7 (L) 06/15/2018    MCV 76.6 (L) 06/15/2018   PLT 165 06/15/2018   No flowsheet data found.  Discharge instruction: per After Visit Summary and "Baby and Me Booklet".  After visit meds:  Allergies as of 06/16/2018   No Known Allergies     Medication List    TAKE these medications   VITAFOL ULTRA 29-0.6-0.4-200 MG Caps Take 1 capsule by mouth daily.       Diet: routine diet  Activity: Advance as tolerated. Pelvic rest for 6 weeks.   Outpatient follow up:4 weeks Follow up Appt:No future appointments. Follow up Visit:No follow-ups on file.  Postpartum contraception: None  Newborn Data: Live born female  Birth Weight: 6 lb 8.4 oz (2960 g) APGAR: 8, 9  Newborn Delivery   Birth date/time:  06/14/2018 19:13:00 Delivery  type:  VBAC, Vacuum Assisted     Baby Feeding: Bottle Disposition:home with mother   06/16/2018 Mirian Mo, MD  I confirm that I have verified the information documented in the resident's note and that I have also personally reperformed the physical exam and all medical decision making activities.  Clayton Bibles, CNM 06/16/18  1:05 PM

## 2018-06-17 ENCOUNTER — Encounter: Payer: Medicaid Other | Admitting: Student

## 2018-06-23 ENCOUNTER — Inpatient Hospital Stay (HOSPITAL_COMMUNITY): Admission: RE | Admit: 2018-06-23 | Payer: MEDICAID | Source: Ambulatory Visit

## 2018-07-15 ENCOUNTER — Encounter: Payer: Self-pay | Admitting: Student

## 2018-07-15 ENCOUNTER — Ambulatory Visit (INDEPENDENT_AMBULATORY_CARE_PROVIDER_SITE_OTHER): Payer: Medicaid Other | Admitting: Student

## 2018-07-15 DIAGNOSIS — O34219 Maternal care for unspecified type scar from previous cesarean delivery: Secondary | ICD-10-CM | POA: Insufficient documentation

## 2018-07-15 DIAGNOSIS — Z1389 Encounter for screening for other disorder: Secondary | ICD-10-CM | POA: Diagnosis not present

## 2018-07-15 NOTE — Progress Notes (Signed)
Subjective:     Jillian Browning is a 27 y.o. female who presents for a postpartum visit. She is 4 weeks postpartum following a spontaneous vaginal delivery. I have fully reviewed the prenatal and intrapartum course. The delivery was at 39/6 gestational weeks. Outcome: spontaneous vaginal delivery. Anesthesia: epidural. Postpartum course has been uneventful. Baby's course has been uneventful. Baby is feeding by bottle - Doesn't remember. Bleeding no bleeding. Bowel function is normal. Bladder function is normal. Patient is not sexually active. Contraception method is none. Postpartum depression screening: negative.  The following portions of the patient's history were reviewed and updated as appropriate: allergies, current medications, past family history, past medical history, past social history, past surgical history and problem list.  Review of Systems Pertinent items are noted in HPI.   Objective:    LMP 09/09/2017 (Exact Date)   General:  alert, cooperative and no distress   Breasts:  inspection negative, no nipple discharge or bleeding, no masses or nodularity palpable  Lungs: clear to auscultation bilaterally  Heart:  regular rate and rhythm, S1, S2 normal, no murmur, click, rub or gallop  Abdomen: soft, non-tender; bowel sounds normal; no masses,  no organomegaly   Vulva:  not evaluated  Vagina: not evaluated  Cervix:  not evaluated  Corpus: not examined  Adnexa:  not evaluated  Rectal Exam: Not performed.        Assessment:     Healthy  postpartum exam. Pap smear not done at today's visit.   Plan:    1. Contraception: none. Patient is very clear that she does not want any birth control. Advised patient that she could get pregnant; patient understands.  2. No complaints. Doing well.  3. Follow up in: 1 year or as needed.

## 2018-07-15 NOTE — Patient Instructions (Signed)
Lm th? no ?? s? d?ng bao cao su ?ng cch, Ng??i l?n How to Use a Condom Correctly, Adult S? d?ng bao cao su ?ng cch v lin t?c l vi?c quan tr?ng ?? phng trnh [place] v trnh ly truy?n cc b?nh ly truy?n qua ???ng tnh d?c (STD). Jerrilyn [place] su c tc d?ng b?ng cch ng?n ch?n s? ti?p xc v?i cc ch?t d?ch c? th? c th? d?n ??n mang thai ho?c ly b?nh nhi?m trng. ?y ???c g?i l ph??ng php ro ch?n. Cc lo?i bao cao su khc nhau no? C c? bao cao su cho nam gi?i v n? gi?i. Bao cao su nam l m?t bao m?ng v?a kht v?i d??ng v?t c??ng c?ng. Luanne Bras cao su nam c th? ???c lm b?ng nhi?u v?t li?u khc nhau, bao g?m:  Cao su.  Polyurethane. ?y l m?t lo?i nh?a d?o.  Cao su t?ng h?p.  Da c?u non ho?c cc lo?i mng t? nhin khc.  Jerrilyn [place] su nam c th? ???c bi tr?n ho?c khng ???c bi tr?n. [place]err[place]n [place] su n? l m?t bao m?ng ???c ??t vo trong m ??o. M?t vng trn bn trong s? gi? bao cao su ? t?i ch?. M?t vng trn khc bao quanh cc n?p g?p bn ngoi c?a da (mi m h?). [place] su n? ???c lm b?ng m?t ch?t gi?ng cao su (nitrile). [place] su ng?n ng?a nh?ng g? [place]err[place]n [place] su c th? ng?n ng?a hi?u qu?:  Mang [place].  STD ly truy?n thng qua d?ch sinh d?c. Nh?ng b?nh l ny bao g?m: ? HIV v AIDS. ? B?nh l?u. ? Chlamydia. ? Vim gan B v C.  Jerr[place]n [place] su c?ng c kh? n?ng b?o v? m?t ph?n kh?i b? cc b?nh STD ly truy?n thng qua ti?p xc da n?u khu v?c b? nhi?m b?nh ???c bao ph? b?i bao cao su. Cc b?nh nhi?m trng ny bao g?m:  B?nh giang mai.  Herpes sinh d?c.  Human papillomavirus (HPV).  B?nh trichomonas.  Jerrilyn [place] su lm b?ng da c?u non v cc lo?i mng t? nhin khc khng c hi?u qu? ng?n ng?a STD b?i v m?m b?nh c th? ?i xuyn qua chng. Ti s? d?ng bao cao su nh? th? no? Bao cao su nam  B?o qu?n bao cao su ? ch? kh, mt.  Tr??c khi s? d?ng bao cao su: ? Ki?m tra bao b ?? ??m b?o ch?a qu ngy h?t h?n. ? ??m b?o c? bao b v bao cao su ??u khng c b?t k? l? h?ng, v?t x hay  v?t rch no. ? ??m b?o r?ng bao cao su khng b? gin ho?c ??i mu.  ??m b?o r?ng bao cao su s?n sng ???c mang theo ?ng chi?u. N ph?i s?n sng ?? m? xu?ng d??i.  Mang bao cao su ln d??ng v?t c??ng c?ng tr??c khi c b?t k? s? ti?p xc no v?i mi?ng, h?u mn ho?c m ??o c?a b?n tnh.  K?p ??u mt c?a bao cao su trong khi m? n xu?ng ??n g?c d??ng v?t sao cho ton b? d??ng v?t ??u ???c bao b?c.  Qu v? c th? s? d?ng ch?t bi tr?n g?c n??c ho?c g?c silicone cng v?i bao cao su nam. Khng s? d?ng ch?t bi tr?n g?c d?u.  Sau khi xu?t tinh, c?m vnh c?a bao cao su khi rt ra.  C?n th?n rt bao cao su ra kh?i b?n tnh v c?n th?n ?? trnh trn d?ch ra ngoi.  B?c bao cao su trong gi?y l?a ho?c gi?y v? sinh v th?i b? vo thng rc. Khng x? bao cao su xu?ng b?n c?u.  Khng s? d?ng cng m?t bao cao su qu m?t l?n. S? d?ng bao cao su m?i m?i l?n qu v? quan h? tnh d?c.  [place]errilyn [place] su n?.  B?o qu?n bao cao su ? ch? kh, mt.  Tr??c khi s? d?ng bao cao su: ? Ki?m tra bao b ?? ??m b?o ch?a qu ngy h?t h?n. ? ??m b?o c? bao b v bao cao su ??u khng c b?t k? l? h?ng, v?t x hay v?t rch no. ? ??m b?o r?ng bao cao su khng b? gin ho?c ??i mu.  ??t bao cao su vo trong m ??o tr??c khi c b?t k? s? ti?p xc no v?i d??ng v?t c?a b?n tnh. ?? lm ?i?u ny: ? Bp vng trn bn trong v ??a n vo trong m ??o gi?ng nh? nt b?ng v? sinh. ? Dng ngn tay tr? ?? ??y n vo ?ng ch?. C?n c kho?ng m?t inch bao cao su n?m ? ngoi m ??o ?? n ph?ng ra trong khi quan h? tnh d?c. ? ??m b?o ph?n bn ngoi c?a bao cao su bao ph? hon ton mi m h?[place]errilyn [place] su n? ? ???c bi tr?n s?n. Qu v? c?ng c th? s? d?ng ch?t bi tr?n g?c n??c ho?c g?c silicone cng v?i bao cao su n?Imagene Sheller s? d?ng ch?t bi tr?n g?c d?u.  B?n tnh c?a qu v? ph?i rt d??ng v?t ra ngay sau khi xu?t tinh. Tr??c khi qu v? ??ng d?y, c?m bao cao su. Xo?n nh? ph?n bn ngoi ?? gi? ch?t d?ch ? trong v c?n th?n rt ra.  B?n  tnh c?a qu v? c?ng c th? c?m bao cao su v rt n ra cng lc rt d??ng v?t ra.  B?c bao cao su trong gi?y l?a ho?c gi?y v? sinh v th?i b? vo thng rc. Khng x? bao cao su xu?ng b?n c?u.  Khng s? d?ng cng m?t bao cao su qu m?t l?n. S? d?ng bao cao su m?i m?i l?n qu v? quan h? tnh d?c.  Ti c th? l?y thm thng tin ? ?u? Tm hi?u thm v? cch s? d?ng ?ng cch bao cao su t?:  Trung tm Ki?m sot v Phng ng?a D?ch b?nh: [email or web address]  [email or web address]: [email or web address]  Lm cha m? Theo k? ho?ch: https://www.[facility]renthood.org/learn/birth-control/condom/how-to-put-a-condom-on  Thng tin ny khng nh?m m?c ?ch thay th? cho l?i khuyn m chuyn gia ch?m [place] s?c kh?e ni v?i qu v?. Hy b?o ??m qu v? ph?i th?o lu?n b?t k? v?n ?? g m qu v? c v?i chuyn gia ch?m  s?c kh?e c?a qu v?. Document Released: 01/09/2017 Document Revised: 01/09/2017 Elsevier Interactive Patient Education  [employer].

## 2019-10-09 NOTE — L&D Delivery Note (Signed)
OB/GYN Faculty Practice Delivery Note  Jillian Browning is a 29 y.o. now [id number] s/p SVD at [redacted]w[redacted]d who was admitted for active labor.   ROM: 0h 65m with clear fluid - AROM at time of delivery GBS Status: Negative Maximum Maternal Temperature: 99.1  Delivery Note At 12:44 PM a viable female was delivered via VBAC, Spontaneous (Presentation: Left Occiput Anterior).  APGAR: 8, 8; weight 5 lb 15.1 oz (2695 g).   Placenta status: Spontaneous, Intact.  Cord: 3 vessels with the following complications: None.  Cord pH: n/a  Anesthesia: Epidural Episiotomy: None Lacerations: 2nd degree;Vaginal Suture Repair: 3.0 vicryl rapide Est. Blood Loss (mL): 109   Postpartum Planning [x]  Mom to postpartum.  Baby to Couplet care / Skin to Skin. [x]  plans to breast/bottle feed [x]  message to sent to schedule follow-up  [x]  vaccines UTD  , MSN, CNM 09/30/20, 12:45 PM

## 2020-03-29 ENCOUNTER — Encounter: Payer: Self-pay | Admitting: Obstetrics

## 2020-03-29 ENCOUNTER — Ambulatory Visit (INDEPENDENT_AMBULATORY_CARE_PROVIDER_SITE_OTHER): Payer: Self-pay | Admitting: *Deleted

## 2020-03-29 ENCOUNTER — Other Ambulatory Visit: Payer: Self-pay

## 2020-03-29 DIAGNOSIS — Z3201 Encounter for pregnancy test, result positive: Secondary | ICD-10-CM

## 2020-03-29 DIAGNOSIS — Z3200 Encounter for pregnancy test, result unknown: Secondary | ICD-10-CM

## 2020-03-29 LAB — POCT URINE PREGNANCY: Preg Test, Ur: POSITIVE — AB

## 2020-03-29 MED ORDER — PRENATE PIXIE 10-0.6-0.4-200 MG PO CAPS: 1.0000 | ORAL_CAPSULE | Freq: Every day | ORAL | 11 refills | Status: DC

## 2020-03-29 NOTE — Progress Notes (Signed)
Jillian Browning presents today for UPT. She has no unusual complaints.  LMP:12/22/2019 EDD:09/27/2020     OBJECTIVE: Appears well, in no apparent distress.  OB History    Gravida  2   Para  1   Term  1   Preterm      AB      Living  1     SAB      TAB      Ectopic      Multiple      Live Births  1          Home UPT Result:positve  In-Office UPT result:positve  Allergies and medications have been reviewed.  ASSESSMENT: Positive pregnancy test  PLAN Prenatal care to be completed at: [facility] PNV sent today.

## 2020-03-30 NOTE — Progress Notes (Signed)
Patient was assessed and managed by nursing staff during this encounter. I have reviewed the chart and agree with the documentation and plan. I have also made any necessary editorial changes.  Warden Fillers, MD 03/30/2020 9:14 PM

## 2020-04-05 ENCOUNTER — Ambulatory Visit (INDEPENDENT_AMBULATORY_CARE_PROVIDER_SITE_OTHER): Payer: Self-pay

## 2020-04-05 DIAGNOSIS — Z3480 Encounter for supervision of other normal pregnancy, unspecified trimester: Secondary | ICD-10-CM

## 2020-04-05 DIAGNOSIS — Z3490 Encounter for supervision of normal pregnancy, unspecified, unspecified trimester: Secondary | ICD-10-CM | POA: Insufficient documentation

## 2020-04-05 DIAGNOSIS — Z3A15 15 weeks gestation of pregnancy: Secondary | ICD-10-CM

## 2020-04-05 DIAGNOSIS — Z3482 Encounter for supervision of other normal pregnancy, second trimester: Secondary | ICD-10-CM

## 2020-04-05 MED ORDER — BLOOD PRESSURE KIT DEVI: 1.0000 | 0 refills | Status: DC | PRN

## 2020-04-05 NOTE — Progress Notes (Signed)
Patient was assessed and managed by nursing staff during this encounter. I have reviewed the chart and agree with the documentation and plan. I have also made any necessary editorial changes.  Warden Fillers, MD 04/05/2020 4:27 PM

## 2020-04-05 NOTE — Progress Notes (Signed)
.    Virtual Visit via Telephone Note  I connected with Jillian Browning on 04/05/20 at  9:00 AM EDT by telephone and verified that I am speaking with the correct person using two identifiers.  Location:  Patient: Jillian Browning interpreter [phone] Provider: Eduard Roux, CMA    I discussed the limitations, risks, security and privacy concerns of performing an evaluation and management service by telephone and the availability of in person appointments. I also discussed with the patient that there may be a patient responsible charge related to this service. The patient expressed understanding and agreed to proceed.   History of Present Illness: PRENATAL INTAKE SUMMARY  Jillian Browning presents today New OB Nurse Interview.  OB History    Gravida  3   Para  2   Term  2   Preterm      AB      Living  2     SAB      TAB      Ectopic      Multiple      Live Births  2          I have reviewed the patient's medical, obstetrical, social, and family histories, medications, and available lab results.  SUBJECTIVE She has no unusual complaints   Observations/Objective: Initial nurse interview for history/labs (New OB)  EDD: 09/27/20 G3P2 FHT: not assessed, non face to face interview  GENERAL APPEARANCE: oriented to person, place and time, non face to face interview   Assessment and Plan: Prenatal care-[facility] Labs/Pap to be completed during NOB visit Babyscripts explained in detail BP cuff sent to [facility] Pt has scale at home   Follow Up Instructions:   I discussed the assessment and treatment plan with the patient. The patient was provided an opportunity to ask questions and all were answered. The patient agreed with the plan and demonstrated an understanding of the instructions.   The patient was advised to call back or seek an in-person evaluation if the symptoms worsen or if the condition fails to improve as anticipated.  I provided 30 minutes of non-face-to-face  time during this encounter.   Dalphine Handing, CMA

## 2020-04-12 ENCOUNTER — Ambulatory Visit (INDEPENDENT_AMBULATORY_CARE_PROVIDER_SITE_OTHER): Payer: Medicaid Other | Admitting: Obstetrics

## 2020-04-12 ENCOUNTER — Other Ambulatory Visit (HOSPITAL_COMMUNITY)
Admission: RE | Admit: 2020-04-12 | Discharge: 2020-04-12 | Disposition: A | Discharge status: Home / Self Care | Payer: Medicaid Other | Source: Ambulatory Visit | Attending: Obstetrics | Admitting: Obstetrics

## 2020-04-12 ENCOUNTER — Encounter: Payer: Self-pay | Admitting: Obstetrics

## 2020-04-12 ENCOUNTER — Other Ambulatory Visit: Payer: Self-pay

## 2020-04-12 VITALS — BP 110/68 | HR 84 | Wt 134.0 lb

## 2020-04-12 DIAGNOSIS — Z3A16 16 weeks gestation of pregnancy: Secondary | ICD-10-CM | POA: Diagnosis not present

## 2020-04-12 DIAGNOSIS — Z3482 Encounter for supervision of other normal pregnancy, second trimester: Secondary | ICD-10-CM

## 2020-04-12 DIAGNOSIS — Z3480 Encounter for supervision of other normal pregnancy, unspecified trimester: Secondary | ICD-10-CM | POA: Insufficient documentation

## 2020-04-12 NOTE — Progress Notes (Signed)
Subjective:    Jillian Browning is being seen today for her first obstetrical visit.  This is a planned pregnancy. She is at [redacted]w[redacted]d gestation. Her obstetrical history is significant for none. Relationship with FOB: spouse, living together. Patient does intend to breast feed. Pregnancy history fully reviewed.  The information documented in the HPI was reviewed and verified.  Menstrual History: OB History    Gravida  3   Para  2   Term  2   Preterm      AB      Living  2     SAB      TAB      Ectopic      Multiple      Live Births  2            Patient's last menstrual period was 12/22/2019.    Past Medical History:  Diagnosis Date  . Tuberculosis 2016    Past Surgical History:  Procedure Laterality Date  . CESAREAN SECTION      (Not in a hospital admission)  No Known Allergies  Social History   Tobacco Use  . Smoking status: Never Smoker  . Smokeless tobacco: Never Used  Substance Use Topics  . Alcohol use: Never    History reviewed. No pertinent family history.   Review of Systems Constitutional: negative for weight loss Gastrointestinal: negative for vomiting Genitourinary:negative for genital lesions and vaginal discharge and dysuria Musculoskeletal:negative for back pain Behavioral/Psych: negative for abusive relationship, depression, illegal drug usage and tobacco use    Objective:    BP 110/68   Pulse 84   Wt 134 lb (60.8 kg)   LMP 12/22/2019   BMI 23.74 kg/m  General Appearance:    Alert, cooperative, no distress, appears stated age  Head:    Normocephalic, without obvious abnormality, atraumatic  Eyes:    PERRL, conjunctiva/corneas clear, EOM's intact, fundi    benign, both eyes  Ears:    Normal TM's and external ear canals, both ears  Nose:   Nares normal, septum midline, mucosa normal, no drainage    or sinus tenderness  Throat:   Lips, mucosa, and tongue normal; teeth and gums normal  Neck:   Supple, symmetrical, trachea midline, no  adenopathy;    thyroid:  no enlargement/tenderness/nodules; no carotid   bruit or JVD  Back:     Symmetric, no curvature, ROM normal, no CVA tenderness  Lungs:     Clear to auscultation bilaterally, respirations unlabored  Chest Wall:    No tenderness or deformity   Heart:    Regular rate and rhythm, S1 and S2 normal, no murmur, rub   or gallop  Breast Exam:    No tenderness, masses, or nipple abnormality  Abdomen:     Soft, non-tender, bowel sounds active all four quadrants,    no masses, no organomegaly  Genitalia:    Normal female without lesion, discharge or tenderness  Extremities:   Extremities normal, atraumatic, no cyanosis or edema  Pulses:   2+ and symmetric all extremities  Skin:   Skin color, texture, turgor normal, no rashes or lesions  Lymph nodes:   Cervical, supraclavicular, and axillary nodes normal  Neurologic:   CNII-XII intact, normal strength, sensation and reflexes    throughout      Lab Review Urine pregnancy test Labs reviewed yes Radiologic studies reviewed no  Assessment:     1. Supervision of other normal pregnancy, antepartum Rx: - Cytology - PAP( [place]) -  Cervicovaginal ancillary only( [place]) - CBC/D/Plt+RPR+Rh+ABO+Rub Ab... - Genetic Screening - Culture, OB Urine   Plan:     Prenatal vitamins.  Counseling provided regarding continued use of seat belts, cessation of alcohol consumption, smoking or use of illicit drugs; infection precautions i.e., influenza/TDAP immunizations, toxoplasmosis,CMV, parvovirus, listeria and varicella; workplace safety, exercise during pregnancy; routine dental care, safe medications, sexual activity, hot tubs, saunas, pools, travel, caffeine use, fish and methlymercury, potential toxins, hair treatments, varicose veins Weight gain recommendations per IOM guidelines reviewed: underweight/BMI< 18.5--> gain 28 - 40 lbs; normal weight/BMI 18.5 - 24.9--> gain 25 - 35 lbs; overweight/BMI 25 - 29.9--> gain 15 - 25  lbs; obese/BMI >30->gain  11 - 20 lbs Problem list reviewed and updated. FIRST/CF mutation testing/NIPT/QUAD SCREEN/fragile X/Ashkenazi Jewish population testing/Spinal muscular atrophy discussed: requested. Role of ultrasound in pregnancy discussed; fetal survey: requested. Amniocentesis discussed: not indicated    Orders Placed This Encounter  Procedures  . Culture, OB Urine  . CBC/D/Plt+RPR+Rh+ABO+Rub Ab...  . Genetic Screening    Follow up in 4 weeks. 50% of 20 min visit spent on counseling and coordination of care.    Brock Bad, MD 04/12/2020 4:21 PM

## 2020-04-13 LAB — CBC/D/PLT+RPR+RH+ABO+RUB AB...
Antibody Screen: NEGATIVE
Basophils Absolute: 0 10*3/uL (ref 0.0–0.2)
Basos: 0 %
EOS (ABSOLUTE): 0.3 10*3/uL (ref 0.0–0.4)
Eos: 3 %
HCV Ab: <0.1 s/co ratio (ref 0.0–0.9)
HIV Screen 4th Generation wRfx: NONREACTIVE
Hematocrit: 39.6 % (ref 34.0–46.6)
Hemoglobin: 12 g/dL (ref 11.1–15.9)
Hepatitis B Surface Ag: NEGATIVE
Immature Grans (Abs): 0 10*3/uL (ref 0.0–0.1)
Immature Granulocytes: 0 %
Lymphocytes Absolute: 2 10*3/uL (ref 0.7–3.1)
Lymphs: 21 %
MCH: 23.4 pg — ABNORMAL LOW (ref 26.6–33.0)
MCHC: 30.3 g/dL — ABNORMAL LOW (ref 31.5–35.7)
MCV: 77 fL — ABNORMAL LOW (ref 79–97)
Monocytes Absolute: 0.5 10*3/uL (ref 0.1–0.9)
Monocytes: 5 %
Neutrophils Absolute: 6.6 10*3/uL (ref 1.4–7.0)
Neutrophils: 71 %
Platelets: 181 10*3/uL (ref 150–450)
RBC: 5.13 x10E6/uL (ref 3.77–5.28)
RDW: 15.4 % (ref 11.7–15.4)
RPR Ser Ql: NONREACTIVE
Rh Factor: POSITIVE
Rubella Antibodies, IGG: >33 index (ref >0.99)
WBC: 9.5 10*3/uL (ref 3.4–10.8)

## 2020-04-13 LAB — CERVICOVAGINAL ANCILLARY ONLY
Bacterial Vaginitis (gardnerella): NEGATIVE
Candida Glabrata: POSITIVE — AB
Candida Vaginitis: NEGATIVE
Chlamydia: NEGATIVE
Comment: NEGATIVE
Comment: NEGATIVE
Comment: NEGATIVE
Comment: NEGATIVE
Comment: NEGATIVE
Comment: NORMAL
Neisseria Gonorrhea: NEGATIVE
Trichomonas: NEGATIVE

## 2020-04-13 LAB — HCV INTERPRETATION

## 2020-04-14 LAB — URINE CULTURE, OB REFLEX: Organism ID, Bacteria: NO GROWTH

## 2020-04-14 LAB — CYTOLOGY - PAP
Adequacy: ABSENT
Diagnosis: NEGATIVE
Diagnosis: REACTIVE

## 2020-04-14 LAB — CULTURE, OB URINE

## 2020-04-19 ENCOUNTER — Encounter: Payer: Self-pay | Admitting: Obstetrics

## 2020-04-20 ENCOUNTER — Encounter: Payer: Self-pay | Admitting: Obstetrics

## 2020-05-10 ENCOUNTER — Encounter: Payer: Self-pay | Admitting: Obstetrics and Gynecology

## 2020-05-10 ENCOUNTER — Telehealth (INDEPENDENT_AMBULATORY_CARE_PROVIDER_SITE_OTHER): Payer: Medicaid Other | Admitting: Obstetrics and Gynecology

## 2020-05-10 DIAGNOSIS — Z98891 History of uterine scar from previous surgery: Secondary | ICD-10-CM

## 2020-05-10 DIAGNOSIS — Z3480 Encounter for supervision of other normal pregnancy, unspecified trimester: Secondary | ICD-10-CM

## 2020-05-10 DIAGNOSIS — O34219 Maternal care for unspecified type scar from previous cesarean delivery: Secondary | ICD-10-CM

## 2020-05-10 DIAGNOSIS — Z3A20 20 weeks gestation of pregnancy: Secondary | ICD-10-CM

## 2020-05-10 NOTE — Progress Notes (Signed)
   OBSTETRICS PRENATAL VIRTUAL VISIT ENCOUNTER NOTE  Provider location: Center for [facility]   I connected with Jillian Browning on 05/10/20 at  9:15 AM EDT by MyChart Video Encounter at home and verified that I am speaking with the correct person using two identifiers.   I discussed the limitations, risks, security and privacy concerns of performing an evaluation and management service virtually and the availability of in person appointments. I also discussed with the patient that there may be a patient responsible charge related to this service. The patient expressed understanding and agreed to proceed. Subjective:  Jillian Browning is a 29 y.o. G3P2002 at [redacted]w[redacted]d being seen today for ongoing prenatal care.  She is currently monitored for the following issues for this low-risk pregnancy and has Alpha thalassemia silent carrier; Language barrier affecting health care; History of tuberculosis; Previous cesarean section; VBAC (vaginal birth after Cesarean); and Encounter for supervision of normal pregnancy, antepartum on their problem list.  Patient reports no complaints.  Contractions: Not present. Vag. Bleeding: None.  Movement: Present. Denies any leaking of fluid.   The following portions of the patient's history were reviewed and updated as appropriate: allergies, current medications, past family history, past medical history, past social history, past surgical history and problem list.   Objective:  There were no vitals filed for this visit.  Fetal Status:     Movement: Present     General:  Alert, oriented and cooperative. Patient is in no acute distress.  Respiratory: Normal respiratory effort, no problems with respiration noted  Mental Status: Normal mood and affect. Normal behavior. Normal judgment and thought content.  Rest of physical exam deferred due to type of encounter  Imaging: No results found.  Assessment and Plan:  Pregnancy: G3P2002 at 108w0d 1. Supervision of other  normal pregnancy, antepartum Stable Anatomy scan ordered  2. VBAC (vaginal birth after Cesarean) Successful VBAC with last pregnancy Discuss VBAC at later OB visits  3. Previous cesarean section See above  Preterm labor symptoms and general obstetric precautions including but not limited to vaginal bleeding, contractions, leaking of fluid and fetal movement were reviewed in detail with the patient. I discussed the assessment and treatment plan with the patient. The patient was provided an opportunity to ask questions and all were answered. The patient agreed with the plan and demonstrated an understanding of the instructions. The patient was advised to call back or seek an in-person office evaluation/go to [facility] at [facility] for any urgent or concerning symptoms. Please refer to After Visit Summary for other counseling recommendations.   I provided 8 minutes of face-to-face time during this encounter.  Return in about 4 weeks (around 06/07/2020) for OB visit, virtual, any provider.  No future appointments.  Hermina Staggers, MD Center for [facility], [facility]

## 2020-05-10 NOTE — Progress Notes (Signed)
Virtual Visit via Telephone Note  I connected with Jillian Browning on 05/10/20 at  9:15 AM EDT by telephone and verified that I am speaking with the correct person using two identifiers.  Unable to check BP; pt does not have batteries.

## 2020-05-13 ENCOUNTER — Other Ambulatory Visit: Payer: Self-pay | Admitting: *Deleted

## 2020-05-13 ENCOUNTER — Other Ambulatory Visit: Payer: Self-pay | Admitting: Obstetrics and Gynecology

## 2020-05-13 ENCOUNTER — Other Ambulatory Visit: Payer: Self-pay

## 2020-05-13 ENCOUNTER — Ambulatory Visit: Payer: Medicaid Other | Attending: Obstetrics and Gynecology

## 2020-05-13 DIAGNOSIS — Z363 Encounter for antenatal screening for malformations: Secondary | ICD-10-CM | POA: Diagnosis not present

## 2020-05-13 DIAGNOSIS — Z148 Genetic carrier of other disease: Secondary | ICD-10-CM

## 2020-05-13 DIAGNOSIS — Z362 Encounter for other antenatal screening follow-up: Secondary | ICD-10-CM

## 2020-05-13 DIAGNOSIS — Z3480 Encounter for supervision of other normal pregnancy, unspecified trimester: Secondary | ICD-10-CM

## 2020-05-13 DIAGNOSIS — Z3A18 18 weeks gestation of pregnancy: Secondary | ICD-10-CM

## 2020-05-13 DIAGNOSIS — O34219 Maternal care for unspecified type scar from previous cesarean delivery: Secondary | ICD-10-CM

## 2020-06-07 ENCOUNTER — Encounter: Payer: Self-pay | Admitting: Obstetrics

## 2020-06-07 ENCOUNTER — Ambulatory Visit (INDEPENDENT_AMBULATORY_CARE_PROVIDER_SITE_OTHER): Payer: Medicaid Other | Admitting: Obstetrics

## 2020-06-07 VITALS — BP 99/66 | HR 92 | Wt 142.0 lb

## 2020-06-07 DIAGNOSIS — Z3A21 21 weeks gestation of pregnancy: Secondary | ICD-10-CM

## 2020-06-07 DIAGNOSIS — Z3480 Encounter for supervision of other normal pregnancy, unspecified trimester: Secondary | ICD-10-CM

## 2020-06-07 DIAGNOSIS — O34219 Maternal care for unspecified type scar from previous cesarean delivery: Secondary | ICD-10-CM

## 2020-06-07 DIAGNOSIS — Z98891 History of uterine scar from previous surgery: Secondary | ICD-10-CM

## 2020-06-07 NOTE — Progress Notes (Signed)
ROB   Pt and husband present today.  CC: None

## 2020-06-07 NOTE — Progress Notes (Signed)
Subjective:  Jillian Browning is a 29 y.o. G3P2002 at [redacted]w[redacted]d being seen today for ongoing prenatal care.  She is currently monitored for the following issues for this low-risk pregnancy and has Alpha thalassemia silent carrier; Language barrier affecting health care; History of tuberculosis; Previous cesarean section; VBAC (vaginal birth after Cesarean); and Encounter for supervision of normal pregnancy, antepartum on their problem list.  Patient reports no complaints.  Contractions: Irritability. Vag. Bleeding: None.  Movement: Present. Denies leaking of fluid.   The following portions of the patient's history were reviewed and updated as appropriate: allergies, current medications, past family history, past medical history, past social history, past surgical history and problem list. Problem list updated.  Objective:   Vitals:   06/07/20 0824  BP: 99/66  Pulse: 92  Weight: 142 lb (64.4 kg)    Fetal Status:     Movement: Present     General:  Alert, oriented and cooperative. Patient is in no acute distress.  Skin: Skin is warm and dry. No rash noted.   Cardiovascular: Normal heart rate noted  Respiratory: Normal respiratory effort, no problems with respiration noted  Abdomen: Soft, gravid, appropriate for gestational age. Pain/Pressure: Absent     Pelvic:  Cervical exam deferred        Extremities: Normal range of motion.  Edema: None  Mental Status: Normal mood and affect. Normal behavior. Normal judgment and thought content.   Urinalysis:      Assessment and Plan:  Pregnancy: G3P2002 at [redacted]w[redacted]d  1. Supervision of other normal pregnancy, antepartum  2. Previous cesarean section  3. History of VBAC  4. Desires VBAC (vaginal birth after cesarean) trial   Preterm labor symptoms and general obstetric precautions including but not limited to vaginal bleeding, contractions, leaking of fluid and fetal movement were reviewed in detail with the patient. Please refer to After Visit Summary for  other counseling recommendations.   Return in about 4 weeks (around 07/05/2020) for ROB.   Brock Bad, MD  06/07/20

## 2020-06-14 ENCOUNTER — Ambulatory Visit: Payer: Medicaid Other | Admitting: *Deleted

## 2020-06-14 ENCOUNTER — Encounter: Payer: Self-pay | Admitting: *Deleted

## 2020-06-14 ENCOUNTER — Ambulatory Visit: Payer: Medicaid Other | Attending: Obstetrics and Gynecology

## 2020-06-14 ENCOUNTER — Other Ambulatory Visit: Payer: Self-pay

## 2020-06-14 VITALS — BP 118/64 | HR 84

## 2020-06-14 DIAGNOSIS — Z362 Encounter for other antenatal screening follow-up: Secondary | ICD-10-CM

## 2020-06-14 DIAGNOSIS — O34219 Maternal care for unspecified type scar from previous cesarean delivery: Secondary | ICD-10-CM

## 2020-06-14 DIAGNOSIS — Z148 Genetic carrier of other disease: Secondary | ICD-10-CM

## 2020-06-14 DIAGNOSIS — Z3A22 22 weeks gestation of pregnancy: Secondary | ICD-10-CM

## 2020-07-05 ENCOUNTER — Ambulatory Visit (INDEPENDENT_AMBULATORY_CARE_PROVIDER_SITE_OTHER): Payer: Medicaid Other | Admitting: Advanced Practice Midwife

## 2020-07-05 ENCOUNTER — Other Ambulatory Visit: Payer: Self-pay

## 2020-07-05 VITALS — BP 111/72 | HR 100 | Wt 149.0 lb

## 2020-07-05 DIAGNOSIS — Z98891 History of uterine scar from previous surgery: Secondary | ICD-10-CM

## 2020-07-05 DIAGNOSIS — Z3480 Encounter for supervision of other normal pregnancy, unspecified trimester: Secondary | ICD-10-CM

## 2020-07-05 DIAGNOSIS — Z3A25 25 weeks gestation of pregnancy: Secondary | ICD-10-CM

## 2020-07-05 DIAGNOSIS — Z789 Other specified health status: Secondary | ICD-10-CM

## 2020-07-05 NOTE — Patient Instructions (Signed)
Third Trimester of Pregnancy The third trimester is from week 28 through week 40 (months 7 through 9). The third trimester is a time when the unborn baby (fetus) is growing rapidly. At the end of the ninth month, the fetus is about 20 inches in length and weighs 6-10 pounds. Body changes during your third trimester Your body will continue to go through many changes during pregnancy. The changes vary from woman to woman. During the third trimester:  Your weight will continue to increase. You can expect to gain 25-35 pounds (11-16 kg) by the end of the pregnancy.  You may begin to get stretch marks on your hips, abdomen, and breasts.  You may urinate more often because the fetus is moving lower into your pelvis and pressing on your bladder.  You may develop or continue to have heartburn. This is caused by increased hormones that slow down muscles in the digestive tract.  You may develop or continue to have constipation because increased hormones slow digestion and cause the muscles that push waste through your intestines to relax.  You may develop hemorrhoids. These are swollen veins (varicose veins) in the rectum that can itch or be painful.  You may develop swollen, bulging veins (varicose veins) in your legs.  You may have increased body aches in the pelvis, back, or thighs. This is due to weight gain and increased hormones that are relaxing your joints.  You may have changes in your hair. These can include thickening of your hair, rapid growth, and changes in texture. Some women also have hair loss during or after pregnancy, or hair that feels dry or thin. Your hair will most likely return to normal after your baby is born.  Your breasts will continue to grow and they will continue to become tender. A yellow fluid (colostrum) may leak from your breasts. This is the first milk you are producing for your baby.  Your belly button may stick out.  You may notice more swelling in your hands,  face, or ankles.  You may have increased tingling or numbness in your hands, arms, and legs. The skin on your belly may also feel numb.  You may feel short of breath because of your expanding uterus.  You may have more problems sleeping. This can be caused by the size of your belly, increased need to urinate, and an increase in your body's metabolism.  You may notice the fetus "dropping," or moving lower in your abdomen (lightening).  You may have increased vaginal discharge.  You may notice your joints feel loose and you may have pain around your pelvic bone. What to expect at prenatal visits You will have prenatal exams every 2 weeks until week 36. Then you will have weekly prenatal exams. During a routine prenatal visit:  You will be weighed to make sure you and the baby are growing normally.  Your blood pressure will be taken.  Your abdomen will be measured to track your baby's growth.  The fetal heartbeat will be listened to.  Any test results from the previous visit will be discussed.  You may have a cervical check near your due date to see if your cervix has softened or thinned (effaced).  You will be tested for Group B streptococcus. This happens between 35 and 37 weeks. Your health care provider may ask you:  What your birth plan is.  How you are feeling.  If you are feeling the baby move.  If you have had any abnormal   symptoms, such as leaking fluid, bleeding, severe headaches, or abdominal cramping.  If you are using any tobacco products, including cigarettes, chewing tobacco, and electronic cigarettes.  If you have any questions. Other tests or screenings that may be performed during your third trimester include:  Blood tests that check for low iron levels (anemia).  Fetal testing to check the health, activity level, and growth of the fetus. Testing is done if you have certain medical conditions or if there are problems during the pregnancy.  Nonstress test  (NST). This test checks the health of your baby to make sure there are no signs of problems, such as the baby not getting enough oxygen. During this test, a belt is placed around your belly. The baby is made to move, and its heart rate is monitored during movement. What is false labor? False labor is a condition in which you feel small, irregular tightenings of the muscles in the womb (contractions) that usually go away with rest, changing position, or drinking water. These are called Braxton Hicks contractions. Contractions may last for hours, days, or even weeks before true labor sets in. If contractions come at regular intervals, become more frequent, increase in intensity, or become painful, you should see your health care provider. What are the signs of labor?  Abdominal cramps.  Regular contractions that start at 10 minutes apart and become stronger and more frequent with time.  Contractions that start on the top of the uterus and spread down to the lower abdomen and back.  Increased pelvic pressure and dull back pain.  A watery or bloody mucus discharge that comes from the vagina.  Leaking of amniotic fluid. This is also known as your "water breaking." It could be a slow trickle or a gush. Let your health care provider know if it has a color or strange odor. If you have any of these signs, call your health care provider right away, even if it is before your due date. Follow these instructions at home: Medicines  Follow your health care provider's instructions regarding medicine use. Specific medicines may be either safe or unsafe to take during pregnancy.  Take a prenatal vitamin that contains at least 600 micrograms (mcg) of folic acid.  If you develop constipation, try taking a stool softener if your health care provider approves. Eating and drinking   Eat a balanced diet that includes fresh fruits and vegetables, whole grains, good sources of protein such as meat, eggs, or tofu,  and low-fat dairy. Your health care provider will help you determine the amount of weight gain that is right for you.  Avoid raw meat and uncooked cheese. These carry germs that can cause birth defects in the baby.  If you have low calcium intake from food, talk to your health care provider about whether you should take a daily calcium supplement.  Eat four or five small meals rather than three large meals a day.  Limit foods that are high in fat and processed sugars, such as fried and sweet foods.  To prevent constipation: ? Drink enough fluid to keep your urine clear or pale yellow. ? Eat foods that are high in fiber, such as fresh fruits and vegetables, whole grains, and beans. Activity  Exercise only as directed by your health care provider. Most women can continue their usual exercise routine during pregnancy. Try to exercise for 30 minutes at least 5 days a week. Stop exercising if you experience uterine contractions.  Avoid heavy lifting.  Do   not exercise in extreme heat or humidity, or at high altitudes.  Wear low-heel, comfortable shoes.  Practice good posture.  You may continue to have sex unless your health care provider tells you otherwise. Relieving pain and discomfort  Take frequent breaks and rest with your legs elevated if you have leg cramps or low back pain.  Take warm sitz baths to soothe any pain or discomfort caused by hemorrhoids. Use hemorrhoid cream if your health care provider approves.  Wear a good support bra to prevent discomfort from breast tenderness.  If you develop varicose veins: ? Wear support pantyhose or compression stockings as told by your healthcare provider. ? Elevate your feet for 15 minutes, 3-4 times a day. Prenatal care  Write down your questions. Take them to your prenatal visits.  Keep all your prenatal visits as told by your health care provider. This is important. Safety  Wear your seat belt at all times when driving.  Make  a list of emergency phone numbers, including numbers for family, friends, the hospital, and police and fire departments. General instructions  Avoid cat litter boxes and soil used by cats. These carry germs that can cause birth defects in the baby. If you have a cat, ask someone to clean the litter box for you.  Do not travel far distances unless it is absolutely necessary and only with the approval of your health care provider.  Do not use hot tubs, steam rooms, or saunas.  Do not drink alcohol.  Do not use any products that contain nicotine or tobacco, such as cigarettes and e-cigarettes. If you need help quitting, ask your health care provider.  Do not use any medicinal herbs or unprescribed drugs. These chemicals affect the formation and growth of the baby.  Do not douche or use tampons or scented sanitary pads.  Do not cross your legs for long periods of time.  To prepare for the arrival of your baby: ? Take prenatal classes to understand, practice, and ask questions about labor and delivery. ? Make a trial run to the hospital. ? Visit the hospital and tour the maternity area. ? Arrange for maternity or paternity leave through employers. ? Arrange for family and friends to take care of pets while you are in the hospital. ? Purchase a rear-facing car seat and make sure you know how to install it in your car. ? Pack your hospital bag. ? Prepare the baby's nursery. Make sure to remove all pillows and stuffed animals from the baby's crib to prevent suffocation.  Visit your dentist if you have not gone during your pregnancy. Use a soft toothbrush to brush your teeth and be gentle when you floss. Contact a health care provider if:  You are unsure if you are in labor or if your water has broken.  You become dizzy.  You have mild pelvic cramps, pelvic pressure, or nagging pain in your abdominal area.  You have lower back pain.  You have persistent nausea, vomiting, or  diarrhea.  You have an unusual or bad smelling vaginal discharge.  You have pain when you urinate. Get help right away if:  Your water breaks before 37 weeks.  You have regular contractions less than 5 minutes apart before 37 weeks.  You have a fever.  You are leaking fluid from your vagina.  You have spotting or bleeding from your vagina.  You have severe abdominal pain or cramping.  You have rapid weight loss or weight gain.  You have   shortness of breath with chest pain.  You notice sudden or extreme swelling of your face, hands, ankles, feet, or legs.  Your baby makes fewer than 10 movements in 2 hours.  You have severe headaches that do not go away when you take medicine.  You have vision changes. Summary  The third trimester is from week 28 through week 40, months 7 through 9. The third trimester is a time when the unborn baby (fetus) is growing rapidly.  During the third trimester, your discomfort may increase as you and your baby continue to gain weight. You may have abdominal, leg, and back pain, sleeping problems, and an increased need to urinate.  During the third trimester your breasts will keep growing and they will continue to become tender. A yellow fluid (colostrum) may leak from your breasts. This is the first milk you are producing for your baby.  False labor is a condition in which you feel small, irregular tightenings of the muscles in the womb (contractions) that eventually go away. These are called Braxton Hicks contractions. Contractions may last for hours, days, or even weeks before true labor sets in.  Signs of labor can include: abdominal cramps; regular contractions that start at 10 minutes apart and become stronger and more frequent with time; watery or bloody mucus discharge that comes from the vagina; increased pelvic pressure and dull back pain; and leaking of amniotic fluid. This information is not intended to replace advice given to you by your  health care provider. Make sure you discuss any questions you have with your health care provider. Document Revised: 01/15/2019 Document Reviewed: 10/30/2016 [employer]vier Patient Education  2020 [employer]  

## 2020-07-05 NOTE — Progress Notes (Signed)
° °  PRENATAL VISIT NOTE  Subjective:  Jillian Browning is a 29 y.o. G3P2002 at [redacted]w[redacted]d being seen today for ongoing prenatal care.  She is currently monitored for the following issues for this low-risk pregnancy and has Alpha thalassemia silent carrier; Language barrier affecting health care; History of tuberculosis; Previous cesarean section; VBAC (vaginal birth after Cesarean); and Encounter for supervision of normal pregnancy, antepartum on their problem list.  Patient reports no complaints.  Contractions: Not present. Vag. Bleeding: None.  Movement: Present. Denies leaking of fluid.   The following portions of the patient's history were reviewed and updated as appropriate: allergies, current medications, past family history, past medical history, past social history, past surgical history and problem list.   Objective:   Vitals:   07/05/20 0826  BP: 111/72  Pulse: 100  Weight: 149 lb (67.6 kg)    Fetal Status: Fetal Heart Rate (bpm): 162   Movement: Present     General:  Alert, oriented and cooperative. Patient is in no acute distress.  Skin: Skin is warm and dry. No rash noted.   Cardiovascular: Normal heart rate noted  Respiratory: Normal respiratory effort, no problems with respiration noted  Abdomen: Soft, gravid, appropriate for gestational age.  Pain/Pressure: Absent     Pelvic: Cervical exam deferred        Extremities: Normal range of motion.  Edema: None  Mental Status: Normal mood and affect. Normal behavior. Normal judgment and thought content.   Assessment and Plan:  Pregnancy: G3P2002 at [redacted]w[redacted]d 1. Supervision of other normal pregnancy, antepartum --Anticipatory guidance about next visits/weeks of pregnancy given. --Next visit in office in 2 weeks for GTT  2. History of VBAC --C/S x 1 then successful VBAC. Desires VBAC with this pregnancy.  3. Language barrier affecting health care --Pt speaks [place] prefers husband as interpreter who is present today.  4. [redacted] weeks  gestation of pregnancy  Preterm labor symptoms and general obstetric precautions including but not limited to vaginal bleeding, contractions, leaking of fluid and fetal movement were reviewed in detail with the patient. Please refer to After Visit Summary for other counseling recommendations.   Return in about 2 weeks (around 07/19/2020).  No future appointments.  [name], CNM

## 2020-07-19 ENCOUNTER — Encounter: Payer: Self-pay | Admitting: Women's Health

## 2020-07-19 ENCOUNTER — Other Ambulatory Visit: Payer: Medicaid Other

## 2020-07-19 ENCOUNTER — Ambulatory Visit (INDEPENDENT_AMBULATORY_CARE_PROVIDER_SITE_OTHER): Payer: Medicaid Other | Admitting: Women's Health

## 2020-07-19 ENCOUNTER — Other Ambulatory Visit: Payer: Self-pay

## 2020-07-19 VITALS — BP 109/73 | HR 100 | Wt 153.0 lb

## 2020-07-19 DIAGNOSIS — Z3480 Encounter for supervision of other normal pregnancy, unspecified trimester: Secondary | ICD-10-CM | POA: Diagnosis not present

## 2020-07-19 DIAGNOSIS — Z98891 History of uterine scar from previous surgery: Secondary | ICD-10-CM

## 2020-07-19 DIAGNOSIS — Z758 Other problems related to medical facilities and other health care: Secondary | ICD-10-CM

## 2020-07-19 DIAGNOSIS — Z23 Encounter for immunization: Secondary | ICD-10-CM | POA: Diagnosis not present

## 2020-07-19 DIAGNOSIS — Z789 Other specified health status: Secondary | ICD-10-CM

## 2020-07-19 DIAGNOSIS — D563 Thalassemia minor: Secondary | ICD-10-CM

## 2020-07-19 DIAGNOSIS — Z8611 Personal history of tuberculosis: Secondary | ICD-10-CM

## 2020-07-19 DIAGNOSIS — O34219 Maternal care for unspecified type scar from previous cesarean delivery: Secondary | ICD-10-CM

## 2020-07-19 NOTE — Progress Notes (Signed)
Pt presents for ROB, 2 gtt labs, Tdap, and Flu vaccines. Pt reports no complaints today.

## 2020-07-19 NOTE — Patient Instructions (Addendum)
Maternity Assessment Unit (MAU)  The Maternity Assessment Unit (MAU) is located at the [facility] and [place] [facility]. The address is: [address], [place], [place], [place] [address]. Please see map below for additional directions.    The Maternity Assessment Unit is designed to help you during your pregnancy, and for up to 6 weeks after delivery, with any pregnancy- or postpartum-related emergencies, if you think you are in labor, or if your water has broken. For example, if you experience nausea and vomiting, vaginal bleeding, severe abdominal or pelvic pain, elevated blood pressure or other problems related to your pregnancy or postpartum time, please come to the Maternity Assessment Unit for assistance.        Pregnancy and Influenza  Influenza, also called the flu, is an infection of the lungs and airways (respiratory tract). If you are pregnant, you are more likely to catch the flu. You are also more likely to have a more serious case of the flu. This is because pregnancy causes changes to your body's disease-fighting system (immune system), heart, and lungs. If you develop a bad case of the flu, especially with a high fever, this can cause problems for you and your developing baby. How do people get the flu? The flu is caused by a type of germ called a virus. It spreads when virus particles get passed from person to person by:  Being near a sick person who is coughing or sneezing.  Touching something that has the virus on it and then touching your mouth, nose, or face. The influenza virus is most common during the fall and winter. How can I protect myself against the flu?  Get a flu shot. The best way to prevent the flu is to get a flu shot before flu season starts. The flu shot is not dangerous for your developing baby. It may even help protect your baby from the flu for up to 6 months after birth.  Wash your hands often with soap and warm water.  If soap and water are not available, use hand sanitizer.  Do not come in close contact with sick people.  Do not share food, drinks, or utensils with other people.  Avoid touching your eyes, nose, and mouth.  Clean frequently used surfaces at home, school, or work.  Practice healthy lifestyle habits, such as: ? Eating a healthy, balanced diet. ? Drinking plenty of fluids. ? Exercising regularly or as told by your health care provider. ? Sleeping 7-9 hours each night. ? Finding ways to manage stress. What should I do if I have flu symptoms?  If you have any symptoms of the flu, even after getting a flu shot, contact your health care provider right away.  To reduce fever, take over-the-counter acetaminophen as told by your health care provider.  If you have the flu, you may get antiviral medicine to keep the flu from becoming severe and to shorten how long it lasts.  Avoid spreading the flu to others: ? Stay home until you are well. ? Cover your nose and mouth when you cough or sneeze. ? Wash your hands often. Follow these instructions at home:  Take over-the-counter and prescription medicines only as told by your health care provider. Do not take any medicine, including cold or flu medicine, unless your health care provider tells you to do so.  If you were prescribed antiviral medicine, take it as told by your health care provider. Do not stop taking the antiviral  medicine even if you start to feel better.  Eat a nutrient-rich diet that includes fresh fruits and vegetables, whole grains, lean protein, and low-fat dairy.  Drink enough fluid to keep your urine clear or pale yellow.  Get plenty of rest. Contact a health care provider if:  You have fever or chills.  You have a cough, sore throat, or stuffy nose.  You have worsening or unusual: ? Muscle aches. ? Headache. ? Tiredness. ? Loss of appetite.  You have vomiting or diarrhea. Get help right away if:  You have  trouble breathing.  You have chest pain.  You have abdominal pain.  You begin to have labor pains.  You have a fever that does not go down 24 hours after you take medicine.  You do not feel your baby move.  You have diarrhea or vomiting that will not go away.  You have dizziness or confusion.  Your symptoms do not improve, even with treatment. Summary  If you are pregnant, you are more likely to catch the flu. You are also more likely to have a more serious case of the flu.  If you have flu-like symptoms, call your health care provider right away. If you develop a bad case of the flu, especially with a high fever, this can be dangerous for your developing baby.  The best way to prevent the flu is to get a flu shot before flu season starts. The flu shot is not dangerous for your developing baby.  If you have the flu and were prescribed antiviral medicine, take it as told by your health care provider. This information is not intended to replace advice given to you by your health care provider. Make sure you discuss any questions you have with your health care provider. Document Revised: 01/16/2019 Document Reviewed: 11/20/2016 Elsevier Patient Education  [place].       https://www.cdc.gov/vaccines/hcp/vis/vis-statements/tdap.pdf">  V??c xin Tdap (u?n vn, b?ch h?u v ho g): Quy? vi? c?n bi?t ?i?u gi? Tdap (Tetanus, Diphtheria, Pertussis) Vaccine: What You Need to Know 1. T?i sao pha?i tim v??c xin? V??c xin Tdap c th? ng?n ng?a u?n vn, b?ch h?u, v ho g. B?nh b?ch h?u v ho g ly lan t? ng??i sang ng??i. U?n vn xm nh?p vo c? th? qua v?t c?t ho?c v?t th??ng.  U?N VN (T) gy c?ng c? ?au ??n. U?n vn c th? d?n ??n cc v?n ?? s?c kh?e nghim tr?ng, bao g?m khng th? h mi?ng, kh nu?t v kh th?, ho?c t? vong.  B?NH B?[place] H?U (D) c th? d?n ??n kh th?, suy tim, li?t, ho?c t? vong.  HO G (aP), cn g?i l "ho g," c th? gy ho d? d?i, khng th? ki?m  sot ???c khi?n cho kh th?, kh ?n ho?c kh u?ng. Ho g c th? v cng nghim tr?ng ? tr? s? sinh v tr? nh?, gy vim ph?i, co gi?t, t?n th??ng no, ho?c t? vong. ? thanh thi?u nin v ng??i l?n, ho g c th? gy s?t cn, ti?u ti?n khng ki?m sot, ng?t, gy x??ng s??n do ho d? d?i. 2. V?c xin Tdap Tdap ch? dnh cho tr? t? 7 tu?i tr? ln, thanh thi?u nin v ng??i l?n.  Thanh thi?u nin c?n ph?i ???c tim m?t li?u duy nh?t Tdap, t?t nh?t l ? ?? tu?i 11 ho?c 12. Ph? n? mang thai c?n ph?i ???c tim m?t li?u Tdap trong m?i l?n mang thai ?? b?o v? tr? s? sinh kho?i  b? ho g. Tr? nho? co? nguy c? cao nh?t bi? cc bi?n ch?ng n?ng, ?e d?a ma?ng s?ng do ho g. Ng??i l?n ch?a bao gi? ???c tim Tdap th c?n ph?i ???c tim m?t li?u Tdap. Ngoi ra, ng??i l?n c?n ph?i ???c tim m?t li?u nh?c l?i m?i 10 n?m m?t l?n, ho?c s?m h?n trong tr??ng h?p c v?t th??ng ho?c b?ng n?ng v b?n. Li?u nh?c l?i c th? l Tdap ho?c Td (m?t lo?i v?c xin khc phng u?n vn v b?nh b?ch h?u nh?ng khng phng ho g). Tdap co? th? ????c tim cng m?t lc nh? cc lo?i v?c xin khc. 3. Trao ??i v?i chuyn gia ch?m [place] s?c kh?e c?a qu v? Hy cho ng??i tim v?c xin c?a qu v? bi?t n?u ng??i ???c tim v?c xin:  ? c ph?n ?ng d? ?ng sau khi tim li?u tr??c ?y c?a b?t k? lo?i v?c xin no phng u?n vn, b?nh b?ch h?u ho?c ho g, ho?c c b?t k? d? ?ng n?ng, ?e d?a tnh m?ng no.  ? b? hn m, m?c ??  th?c gi?m, ho?c co gi?t ko di trong vng 7 ngy sau khi tim li?u tr??c ?y c?a b?t k? lo?i v?c xin ho g (DTP, DTap, ho?c Tdap) no.  Bi? co gi?t ho?c b? m?t v?n ?? kha?c ? h? th?n kinh.  ? t?ng b? H?i ch?ng Guillain-Barr (cn g?i l GBS).  ? b? ?au ho?c s?ng r?t nhi?u sau khi tim li?u tr??c ?y c?a b?t k? lo?i v?c xin no phng u?n vn ho?c b?ch h?u. Trong m?t s? tr??ng h?p, chuyn gia ch?m [place] s?c kh?e c?a qu v? c th? quy?t ??nh hon tim v?c xin Tdap cho ??n l?n khm trong t??ng lai.  Nh?ng ng??i b? b?nh nh?, ch?ng h?n  nh? c?m l?nh, c th? ???c tim v?c xin. Nh?ng ng??i b? b?nh ? m?c ?? v?a ph?i ho?c n?ng th??ng c?n ph?i ??i cho ??n khi h?i ph?c tr??c khi tim v?c xin Tdap.  Chuyn gia ch?m [place] s?c kh?e c?a qu v? c th? cung c?p thm thng tin cho qu v?. 4. Nguy c? x?y ra ph?n ?ng v?i v?c xin  ?au, ?? ho?c s?ng ? ch? tim, s?t nh?, ?au ??u, c?m th?y m?t m?i v bu?n nn, nn m?a, tiu ch?y, ho?c ?au b?ng ?i khi x?y ra sau khi tim v?c xin Tdap. ?i khi c ng??i bi? ng?t sau khi th?c hi?n m?t thu? thu?t y [place], bao g?m c? tim v?c xin. Cho bc s? bi?t n?u quy? vi? c?m th?y chng m?t, ho?c c nh?ng thay ??i v? th? l?c ho?c  tai.  Nh? v?i b?t k? lo?i thu?c na?o, co? m?t kha? n?ng r?t nho? l m?t lo?i v?c xin gy ra ph?n ?ng d? ?ng n?ng, t?n th??ng nghim tr?ng khc, ho?c t? vong. 5. N?u co? m?t v?n ?? nghim tr?ng thi? sao? Ph?n ?ng d? ?ng c th? x?y ra sau khi ng??i ???c tim phng r?i kh?i phng khm. N?u qu v? th?y cc d?u hi?u ph?n ?ng d? ?ng n?ng (n?i m? ?ay, s?ng m?t v h?ng, kh th?, nh?p ??p c?a tim nhanh, chng m?t, ho?c y?u ?t), hy g?i 9-1-1 v ??a ng??i ? ??n b?nh vi?n g?n nh?t. ??i v?i cc d?u hi?u khc khi?n qu v? lo l?ng, hy g?i cho chuyn gia ch?m  s?c kh?e c?a qu v?.  Cc ph?n ?ng b?t l?i c?n ph?i ???c bo ca?o cho Vaccine Adverse Event Reporting System (H? th?ng Ba?o  ca?o Bi?n c? B?t l?i c?a V??c xin, VAERS). Chuyn gia ch?m [place] s?c kh?e th??ng s? n?p bo co ny ho?c qu v? c th? t? lm. Truy c?p trang web c?a VAERS t?i ??a ch? www.vaers.SamedayNews.es ho?c g?i 1-(989)[phone]. VAERS ch? dnh ?? bo co cc ph?n ?ng v nhn vin VAERS khng t? v?n y t?. 6. The National Vaccine Injury Compensation Program (Ch??ng tri?nh B?i th???ng Th??ng t?t do V??c xin Qu?c gia) Ch??ng tri?nh B?i th???ng Th??ng t?n do V?c xin Qu?c gia (VICP) l m?t ch??ng trnh lin bang ???c ??a ra ?? b?i th???ng cho nh?ng ng??i c th? b? th??ng t?n do loa?i v?c xin no ?Marland Kitchen Truy c?p trang web c?a VICP t?i ??a ch?  GoldCloset.com.ee ho?c g?i [phone] ?? tm hi?u v? ch??ng trnh v v? cch n?p khi?u n?i. Co? gi?i h?n v? th?i gian n?p m?t yu c?u b?i th??ng. 7. Ti c th? tm hi?u thm b?ng cch no?  Hy h?i chuyn gia ch?m [place] s?c kh?e c?a qu v?.  G?i cho s? y t? ??a ph??ng ho?c ti?u bang c?a quy? vi?.  Lin h? v?i Centers for Disease Control and Prevention (Trung tm Ki?m sot va? Pho?ng ng??a D?ch b?nh, CDC): ? Go?i [phone] (1-800-CDC-INFO) ho??c ? Truy c?p vo website c?a CDC t?i ??a ch? http://hunter.com/ B?n k Thng tin V?c xin Tdap (U?n vn, B?ch h?u, Ho g) (01/07/2019) Thng tin ny khng nh?m m?c ?ch thay th? cho l?i khuyn m chuyn gia ch?m [place] s?c kh?e ni v?i qu v?. Hy b?o ??m qu v? ph?i th?o lu?n b?t k? v?n ?? g m qu v? c v?i chuyn gia ch?m [place] s?c kh?e c?a qu v?. Document Revised: 01/26/2019 Document Reviewed: 01/27/2019 Elsevier Patient Education  2020 [place] [facility]  [facility] [address] [place], [place] [address] Phone - [phone]   Fax - [place] [address] [place], [place]  [address] Phone - [phone]   Fax - [phone]  [place]. [address], [place] [address] [place], [place]  [address] Phone - [phone]   Fax - [phone]  [facility] [address] [place], [place]  [address] Phone - [phone]   Fax - [phone]  [place] [address] [place], [place]  [address] Phone - [phone]   Fax - [place] [address] [place], [place]  [address] Phone - [phone]   Fax - [place] [facility] [place] [address] [place], [place]  [address] Phone - [phone]   Fax - [phone]  [place] [address] [place], [place], [place]   [address] Phone - [phone]   Fax - [place] [address] [place], [place]  [address] Phone - [phone]   Fax - [phone] [facility] [place]. [address] [place], [place]  [address] Phone - [phone]   Fax - [phone]  [place] [address] [place], [place]  [address] Phone - [phone]   Fax - [phone]  [facility] [place], [place], [place]  [address] Phone - [phone]   Fax - [place] [address], [place], [place]  [address] Phone - [phone]  Fax - [phone]  [facility] [address], [place], [place]  [address] Phone - [place] [address] [place], [place]  [address] Phone - [phone]   Fax - [place] [address], [place], [place]  [address] Phone - [phone]   Fax - [phone]  [place] [address] [place], [place]  [address] Phone - [phone]   Fax - [place]. [place], [place]  [address] Phone - [phone]   Fax - [place], [place], [place]  [address] Phone - [phone]   Fax - [place] [address], [place], [place]  [address] Phone - [phone]   Fax - [phone]  [place] [address], [place], [place]  [address] Phone - [phone]   Fax - [place] [address] [place], [place]  [address] Phone - [phone]   Fax - [phone]  [place] [address] [place], [place]  [address] Phone - [phone]   Fax - [phone] [place] [address] [place], [place]   [address] Phone - [phone]   Fax - [phone]  [place] [address] [place], [place]  [address] Phone - [phone]   Fax - [place] [address] [place], [place]  [address] Phone - [phone]   Fax [phone]          Thalassemia  Thalassemia is a blood disorder that causes a low level of red blood cells (anemia). This condition is passed from parent to child through abnormal genes (gene mutations). The mutations make it hard for your body to make the protein in red blood cells (hemoglobin) that carries oxygen from your lungs to the rest of your body. Red blood cells do not live long without hemoglobin. Loss of red blood cells leads to anemia, which is the main symptom of thalassemia. There are two main types of thalassemia. The type depends on which part of the hemoglobin is affected.  Alpha thalassemia affects the alpha part of the hemoglobin. This is caused by four genes. You could get two from each parent.  Beta thalassemia affects the beta part of the hemoglobin. This is caused by two genes. You could get one from each parent. Thalassemia can be mild or severe. It depends on how many gene mutations you are born with. The more gene mutations you get, the more severe the condition. A person who inherits just one gene will be a carrier of the condition (thalassemia trait). A person with thalassemia trait may not have any symptoms or may have only mild anemia. A person who inherits two or more genes can have thalassemia minor, thalassemia intermedia, or thalassemia major. Thalassemia is a lifelong condition. There is no cure, but treatment can control symptoms and manage the condition. What are the causes? Thalassemia is cause by gene mutations that are passed down through families. What increases the risk? You are more likely to develop this condition if:  You have a family history of  thalassemia.  Your ancestors are from [place], [place], [place], [place], [place], or the [place]. What are the signs or symptoms? The most common signs and symptoms of thalassemia are the signs and symptoms of anemia. They include:  Weakness.  Tiredness.  Pounding heartbeat.  Dizziness.  Headache.  Leg cramps.  Pale skin.  Confusion.  Shortness of breath. Other signs and symptoms can also occur. You may have:  Yellow eyes or skin, and dark urine (jaundice). The breakdown of red blood cells can cause a yellowing pigment (bilirubin) to build up in your blood.  Weak bones (osteoporosis) and bone fractures. This is because bones can weaken from the effort of making more hemoglobin.  An enlarged spleen. This can lead to a swollen belly. Your spleen can become enlarged from filtering dead red blood cells.  Frequent, severe infections. This occurs if your spleen and bone marrow become weak. These organs make white blood cells that your body needs to fight infections. How is this diagnosed? Your health care provider may suspect thalassemia based on your signs and symptoms, especially if you have a family history of the condition. This condition may be diagnosed:  In childhood, if you have severe forms of thalassemia. This is because symptoms show early in life.  At birth. In the [place] babies are screened for this condition.  In adulthood, if you have thalassemia trait or thalassemia minor. This happens if symptoms of anemia start or if a routine blood test shows unexplained anemia. Blood tests can confirm a thalassemia diagnosis. Blood tests may show:  Low hemoglobin.  Low iron.  Abnormal hemoglobin.  Thalassemia gene mutations. You may need to see a health care provider who specializes in blood diseases (hematologist). How is this treated? Treatment for this condition depends on the type of thalassemia that you have:  If you have thalassemia trait or thalassemia  minor, you may not need treatment. However, you may need treatment if you have thalassemia minor and you develop symptoms during an infection.  If you have thalassemia intermedia, you will have symptoms that require treatment.  If you have major thalassemia, you will have serious symptoms that require regular treatment. Thalassemia treatment may include:  Donated blood (transfusions) to replace red blood cells.  Vitamin B (folic acid) supplements to help produce hemoglobin and red blood cells.  Medicines or injections to remove iron buildup (chelation). This can happen in people who have frequent transfusions. Iron overload can damage heart, liver, and brain cells.  In the case of severe thalassemia: ? The spleen may need to be removed if it becomes damaged. ? Stem cell or bone marrow transplants may be necessary to transplant cells that can make red blood cells. This may be done if transfusions are not working. Follow these instructions at home: Eating and drinking   Follow instructions from your health care provider about eating or drinking restrictions. You may need to avoid foods or drinks that are high in iron or fortified with iron.  Eat foods that are high in fiber, such as fresh fruits and vegetables, whole grains, and beans. Limit foods that are high in fat and processed sugars, such as fried and sweet foods. Nutrition is important for preventing anemia. Activity  Return to your normal activities as told by your health care provider. Ask your health care provider what activities are safe for you.  Exercise is important for maintaining energy and strong bones. Ask your health care provider what amount and type of exercise is safe for you. General instructions   Take over-the-counter and prescription medicines only as told by your health care provider.  Keep all routine vaccinations and flu shots up to date to reduce your risk of infection.  Wash your  hands frequently.  Do  your best to avoid sick people, and stay out of crowds during cold and flu seasons.  Meet with a Dietitian if you are or may become pregnant. A genetic counselor can explain the risks of passing thalassemia to a child.  Keep all follow-up visits as told by your health care provider. This is important. Contact a health care provider if:  You have signs or symptoms of anemia.  You have a fever or other signs of infection.  Your belly is swollen.  You have jaundice. Get help right away if:  You feel very weak or short of breath. Summary  Thalassemia is a blood disorder that causes anemia.  Thalassemia can range from mild to severe.  This condition is passed down through families.  There is no cure, but treatment can manage the symptoms and prevent anemia. This information is not intended to replace advice given to you by your health care provider. Make sure you discuss any questions you have with your health care provider. Document Revised: 01/21/2018 Document Reviewed: 01/21/2018 Elsevier Patient Education  [place] php trnh [place] Contraception Choices Trnh thai, hay cn g?i l ki?m sot vi?c c [place], ?? c?p ??n cc bi?n php ho?c d?ng c? ng?n ng?a c [place]. Ph??ng php hc mn Que c?y thu?c trnh [place]  Que c?y thu?c trnh thai l m?t ?ng nh?a, m?ng ch?a hc mn. ?ng ???c lu?n vo ph?n trn c?a cnh tay. ?ng c th? ? nguyn v? tr trong t?i ?a 3 n?m. Tim thu?c trnh [place] ch? c progestin Tim thu?c trnh [place] ch? c progestin l progestin d?ng tim, m?t d?ng t?ng h?p c?a hc mn progesterone. Thu?c ???c chuyn gia ch?m [place] s?c kh?e tim 3 thng m?t l?n. Thu?c vin trnh [place]  Thu?c vin trnh thai l vin thu?c c hc mn ng?n ng?a c [place]. Qu v? ph?i u?ng thu?c m?i ngy m?t l?n, t?t nh?t l vo cng m?t th?i ?i?m m?i ngy. Mi?ng dn trnh [place]  Mi?ng dn trnh [place] c hc mn ng?n ng?a c [place]. Mi?ng dn ny ???c dn ln da v  ph?i thay m?i tu?n m?t l?n trong ba tu?n v ???c lo?i b? vo tu?n th? t?. C?n m?t ??n thu?c ?? s? d?ng ph??ng php trnh [place] ny. Vng ??t m ??o  Vng ??t m ??o c hc mn ng?n ng?a c [place]. Vng ???c ??t vo trong m ??o trong ba tu?n v ???c b? ra vo tu?n th? t?. Sau ?, qu trnh ???c l?p l?i v?i m?t vng m?i. C?n m?t ??n thu?c ?? s? d?ng ph??ng php trnh [place] ny. Thu?c trnh [place] kh?n c?p Thu?c trnh thai kh?n c?p ng?n ng?a mang thai sau khi quan h? tnh d?c khng c bi?n php b?o v?. Thu?c ? d?ng vin nn v c th? ???c dng t?i ?a 5 ngy sau khi quan h? tnh d?c. Cng u?ng s?m sau quan h? tnh d?c bao nhiu th cng c tc d?ng t?t b?y nhiu. H?u h?t cc thu?c trnh thai kh?n c?p ??u c bn m khng c?n k ??n. Khng ???c dng ph??ng php ny lm hnh th?c trnh [place] duy nh?t c?a qu v?. Ph??ng php mng ng?n Bao cao su dnh cho nam gi?i  Darrick Meigs cao su dnh cho nam gi?i l m?t bao m?ng ?eo trm ln d??ng v?t lc quan h? tnh d?c. Bao cao su ng?n tinh d?ch ?i vo trong c?  th? ph? n?. Bao c th? ???c s? d?ng cng v?i ch?t di?t tinh trng ?? t?ng hi?u qu?Marland Kitchen Ph?i v?t b? sau m?t l?n s? d?ng. Darrick Meigs cao su dnh cho n? gi?i  [place]ora Appl su dnh cho n? gi?i l m?t bao m?m, r?ng ???c ??a vo m ??o tr??c khi quan h? tnh d?c. Bao cao su ny ng?n tinh d?ch ?i vo trong c? th? ph? n?. Ph?i v?t b? sau m?t l?n s? d?ng. Mng ch?n  Mng ch?n l m?t mng m?m, hnh vm. Mng ny ???c ??t vo trong m ??o tr??c khi quan h? tnh d?c, cng v?i ch?t di?t tinh trng. Mng ch?n ng?n tinh trng xm nh?p t? cung v ch?t di?t tinh trng s? tiu di?t tinh trng. Nn ?? l?i mng ch?n trong m ??o trong 6-8 gi? sau khi quan h? tnh d?c v tho ra trong vng 24 gi?. Mng ch?n ph?i ???c k ??n v do chuyn gia ch?m [place] s?c kh?e ??t. Ph?i thay mng ch?n 1-2 n?m m?t l?n, sau khi sinh, sau khi t?ng trn 15 lb (6,8 kg) v sau khi ph?u thu?t vng ch?u. M? c? t? cung  M? c? t? cung l m?t c?c nh?a ho?c cao su m?m, trn bao kht c?  t? cung. M? ???c ??t vo trong m ??o tr??c khi quan h? tnh d?c, cng v?i ch?t di?t tinh trng. M? ny ng?n tinh trng thm nh?p t? cung. M? c? t? cung ph?i ???c ?? nguyn trong 6-8 gi? sau khi quan h? tnh d?c v tho ra trong vng 48 gi?. M? c? t? cung ph?i ???c k ??n v do chuyn gia ch?m [place] s?c kh?e ??t. M? ph?i ???c thay 2 n?m m?t l?n. B?t bi?n  B?t bi?n l mi?ng b?t polyurethane hnh trn, m?m ch?a ch?t di?t tinh trng trn ?Marland Kitchen B?t bi?n gip ng?n tinh trng thm nh?p t? cung v ch?t di?t tinh trng s? tiu di?t tinh trng. ?? s? d?ng, hy lm ?m mi?ng b?t bi?n ? v sau ? ??a vo m ??o. Ph?i ??a vo m ??o tr??c khi quan h?, ?? l?i t?i thi?u 6 gi? sau khi quan h? tnh d?c v tho ra r?i v?t ?i trong vng 30 gi?Marland Kitchen Ch?t di?t tinh trng Ch?t di?t tinh trng l nh?ng ha ch?t di?t ho?c ng?n khng cho tinh trng xm nh?p vo c? t? cung v t? cung. Ch?t ny c th? ? d?ng kem, d?ng ?ng ??c, thu?c ??n, d?ng b?t, ho?c vin nn. Ch?t di?t tinh trng ph?i ???c ??a vo m ??o b?ng m?t d?ng c? ??t tr??c khi quan h? tnh d?c t?i thi?u l 10-15 pht ?? c th?i gian cho thu?c tc d?ng. Qu trnh ny ph?i ???c l?p l?i m?i l?n qu v? quan h? tnh d?c. Thu?c di?t tinh trng khng c?n k ??n. Hessie Diener thai trong t? cung Vng trnh [place] trong t? cung (IUD). IUD l d?ng c? hnh ch? T ???c ??t vo t? cung ph? n?. C hai lo?i vng trnh [place]:  IUD hc mn.Lo?i ny c progestin, m?t d?ng t?ng h?p c?a hc mn progesterone. Lo?i ny c th? duy tr t?i ch? trong 3-5 n?m.  IUD b?ng ??ng.Lo?i ny ???c b?c b?ng dy ??ng. N c th? ? t?i ch? trong 10 n?m.  Cc ph??ng php trnh [place] v?nh vi?n Th?t ?ng d?n tr?ng ? n? Trong ph??ng php ny, ?ng d?n tr?ng c?a ng??i ph? n? ???c b?t, th?t, ho?c ch?n l?i trong khi ph?u thu?t ?? ng?n khng  cho tr?ng ?i vo t? cung. Tri?t sa?n b??ng n?i soi t?? cung [place] ph??ng php ny, m?t ?ng m?m, nh? ???c lu?n vo t?ng ?ng d?n tr?ng. Cc ?ng lu?n ny lm cho m s?o hnh thnh trong cc ?ng  d?n tr?ng v lm t?c cc ?ng d?n tr?ng ?? tinh trng khng th? g?p tr?ng. Th? thu?t ny m?t kho?ng 3 thng ?? c hi?u qu?Sander Nephew v? ph?i s? d?ng m?t hnh th?c trnh [place] khc trong 3 thng ny. Tri?t s?n nam ?y l th? thu?t ?? th?t ?ng d?n tinh d?ch (th?t ?ng d?n tinh). Sau khi lm th? thu?t, nam gi?i v?n c th? xu?t ra ch?t l?ng (tinh d?ch). Cc ph??ng php k? ho?ch t? nhin K? ho?ch ha gia ?nh t? nhin Trong ph??ng php ny, c?p ?i khng quan h? tnh d?c vo nh?ng ngy n? gi?i c th? th? [place]. Ph??ng php theo l?ch C ngh?a l theo di ?? di m?i chu k? kinh nguy?t, nh?n di?n nh?ng ngy c th? th? [place] v khng quan h? tnh d?c vo nh?ng ngy ?. Ph??ng php r?ng tr?ng [place] ph??ng php ny, c?p ?i trnh quan h? tnh d?c vo ngy r?ng tr?ng. Ph??ng php tri?u ch?ng nhi?t Ph??ng php ny ngh?a l khng quan h? tnh d?c trong th?i gian r?ng tr?ng. Ph? n? th??ng ki?m tra tnh tr?ng r?ng tr?ng b?ng cch theo di nh?ng thay ??i v? nhi?t ?? v ?? ??c c?a d?ch nh?y c? t? cung. Ph??ng php sau r?ng tr?ng [place] ph??ng php ny, c?p ?i s? ??i ??n sau khi r?ng tr?ng ?? quan h? tnh d?c. Tm t?t  Trnh thai, hay cn g?i l ki?m sot vi?c c thai, c ngh?a l cc bi?n php ho?c d?ng c? ?? ng?n ng?a c [place].  Ph??ng php trnh [place] b?ng hc mn bao g?m que c?y, thu?c tim, thu?c d?ng vin nn, mi?ng dn, vng ??t m ??o v thu?c trnh [place] kh?n c?p.  Ph??ng php trnh [place] b?ng mng ng?n c th? bao g?m bao cao su dnh cho nam gi?i, bao cao su dnh cho n? gi?i, mng ch?n, m? c? t? cung, b?t bi?n v ch?t di?t tinh trng.  C hai lo?i IUD (vng trnh [place] trong t? cung). M?t IUD c th? ???c ??t trong t? cung ph? n? ?? ng?n ng?a mang thai trong 3-5 n?m.  Tri?t s?n v?nh vi?n c th? ???c th?c hi?n thng qua m?t th? thu?t ? nam gi?i, n? gi?i, ho?c c? hai.  Cc ph??ng php k? ho?ch ha gia ?nh t? nhin l khng quan h? tnh d?c vo nh?ng ngy ph? n? c th? th? [place]. Thng tin ny khng nh?m m?c ?ch thay  th? cho l?i khuyn m chuyn gia ch?m [place] s?c kh?e ni v?i qu v?. Hy b?o ??m qu v? ph?i th?o lu?n b?t k? v?n ?? g m qu v? c v?i chuyn gia ch?m [place] s?c kh?e c?a qu v?. Document Revised: 10/18/2017 Document Reviewed: 01/23/2017 Elsevier Patient Education  2020 [employer].

## 2020-07-19 NOTE — Progress Notes (Signed)
Subjective:  Jillian Browning is a 29 y.o. G3P2002 at [redacted]w[redacted]d being seen today for ongoing prenatal care.  She is currently monitored for the following issues for this low-risk pregnancy and has Alpha thalassemia silent carrier; Language barrier affecting health care; History of tuberculosis; Previous cesarean section; VBAC (vaginal birth after Cesarean); and Encounter for supervision of normal pregnancy, antepartum on their problem list.  Patient reports no complaints.  Contractions: Not present. Vag. Bleeding: None.  Movement: Present. Denies leaking of fluid.   The following portions of the patient's history were reviewed and updated as appropriate: allergies, current medications, past family history, past medical history, past social history, past surgical history and problem list. Problem list updated.  Objective:   Vitals:   07/19/20 0841  BP: 109/73  Pulse: 100  Weight: 153 lb (69.4 kg)    Fetal Status: Fetal Heart Rate (bpm): 155 Fundal Height: 27 cm Movement: Present     General:  Alert, oriented and cooperative. Patient is in no acute distress.  Skin: Skin is warm and dry. No rash noted.   Cardiovascular: Normal heart rate noted  Respiratory: Normal respiratory effort, no problems with respiration noted  Abdomen: Soft, gravid, appropriate for gestational age. Pain/Pressure: Absent     Pelvic: Vag. Bleeding: None     Cervical exam deferred        Extremities: Normal range of motion.  Edema: None  Mental Status: Normal mood and affect. Normal behavior. Normal judgment and thought content.   Urinalysis:      Assessment and Plan:  Pregnancy: G3P2002 at [redacted]w[redacted]d  1. Supervision of other normal pregnancy, antepartum -GTT/labs today -discussed flu and Tdap vaccines, pt accepts -discussed contraception, pt unsure, information given -peds list given  2. VBAC (vaginal birth after Cesarean) -06/2018, pt desires repeat vaginal delivery -MD next visit for VBAC consent  3. Language barrier  affecting health care -[place] interpreter used for entire visit  4. Previous cesarean section -2017 in [place], no records available  5. Alpha thalassemia silent carrier - AMB MFM GENETICS REFERRAL  6. History of tuberculosis -tx 2016 in [place]  Preterm labor symptoms and general obstetric precautions including but not limited to vaginal bleeding, contractions, leaking of fluid and fetal movement were reviewed in detail with the patient. I discussed the assessment and treatment plan with the patient. The patient was provided an opportunity to ask questions and all were answered. The patient agreed with the plan and demonstrated an understanding of the instructions. The patient was advised to call back or seek an in-person office evaluation/go to [facility] at [facility] for any urgent or concerning symptoms. Please refer to After Visit Summary for other counseling recommendations.  Return in about 2 weeks (around 08/02/2020) for in-person LOB/MD ONLY/VBAC consent, needs genetic counseling appt.   Treana Lacour, Odie Sera, NP

## 2020-07-20 LAB — CBC
Hematocrit: 37.6 % (ref 34.0–46.6)
Hemoglobin: 12.2 g/dL (ref 11.1–15.9)
MCH: 26.1 pg — ABNORMAL LOW (ref 26.6–33.0)
MCHC: 32.4 g/dL (ref 31.5–35.7)
MCV: 81 fL (ref 79–97)
Platelets: 189 10*3/uL (ref 150–450)
RBC: 4.67 x10E6/uL (ref 3.77–5.28)
RDW: 13.8 % (ref 11.7–15.4)
WBC: 11.7 10*3/uL — ABNORMAL HIGH (ref 3.4–10.8)

## 2020-07-20 LAB — HIV ANTIBODY (ROUTINE TESTING W REFLEX): HIV Screen 4th Generation wRfx: NONREACTIVE

## 2020-07-20 LAB — GLUCOSE TOLERANCE, 2 HOURS W/ 1HR
Glucose, 1 hour: 106 mg/dL (ref 65–179)
Glucose, 2 hour: 73 mg/dL (ref 65–152)
Glucose, Fasting: 81 mg/dL (ref 65–91)

## 2020-07-20 LAB — RPR: RPR Ser Ql: NONREACTIVE

## 2020-07-26 ENCOUNTER — Ambulatory Visit: Payer: Self-pay | Admitting: Genetic Counselor

## 2020-07-26 ENCOUNTER — Ambulatory Visit: Payer: Medicaid Other | Attending: Women's Health | Admitting: Genetic Counselor

## 2020-07-26 ENCOUNTER — Other Ambulatory Visit: Payer: Self-pay

## 2020-07-26 DIAGNOSIS — Z315 Encounter for genetic counseling: Secondary | ICD-10-CM | POA: Diagnosis not present

## 2020-07-26 DIAGNOSIS — D582 Other hemoglobinopathies: Secondary | ICD-10-CM

## 2020-07-26 DIAGNOSIS — D563 Thalassemia minor: Secondary | ICD-10-CM | POA: Diagnosis not present

## 2020-07-26 DIAGNOSIS — Z148 Genetic carrier of other disease: Secondary | ICD-10-CM

## 2020-07-26 NOTE — Progress Notes (Signed)
07/26/2020  Charvi Adcock [birth year] MRN: [id number] DOV: 07/26/2020  Ms. 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 Ms. Rieger was accompanied to her appointment by her husband, Neita Goodnight. This session was facilitated by [employer] [place] interpreter [place], ID# [phone].  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 Milford is a [id number], 29 y.o. female. Her current pregnancy has completed [redacted]w[redacted]d (Estimated Date of Delivery: 10/12/20). Ms. Quant and her husband have a four year old daughter and a two year old daughter together.  Ms. Olesen denied exposure to environmental toxins or chemical agents. She denied the use of alcohol, tobacco or street drugs. She denied significant viral illnesses, fevers, and bleeding during the course of her pregnancy. Her first daughter was born via Cesarean section and her second daughter via VBAC. Her medical and surgical histories were otherwise noncontributory.  Family History  A three generation pedigree was drafted and reviewed. Both family histories were reviewed and found to be noncontributory for birth defects, intellectual disability, recurrent pregnancy loss, and known genetic conditions.    The patient's ancestry is [place] The father of the pregnancy's ethnicity is [place] Ashkenazi Jewish ancestry and consanguinity were denied. Pedigree will be scanned under Media.  Discussion  Alpha-thalassemia:  Ms. Horsfall had [id number] carrier screening performed through [place]. The results of the screen identified her as a silent carrier for alpha-thalassemia (aa/a-). Alpha-thalassemia is different in its inheritance compared to other hemoglobinopathies as there are two copies of two alpha globin genes (HBA1 and HBA2) on each chromosome 16, or four alpha globin genes total (aa/aa). A person can  be a carrier of one alpha gene mutation (aa/a-), also referred to as a "silent carrier". A person who carries two alpha globin gene mutations can either carry them in cis (both on the same chromosome, denoted as aa/--) or in trans (on different chromosomes, denoted as a-/a-). The [place] deletion (--SEA) is a common deletion of two alpha-globin genes in cis in individuals with [place] or other [place] ancestry. Heterozygous carrier rates of the [place] deletion in [place] populations range from 4-14% [facility] et al., 2000).    There are several different forms of alpha-thalassemia. The most severe form of alpha-thalassemia, Hb Barts, is associated with an absence of alpha globin chain synthesis as a result of deletions of all four alpha globin genes (--/--).  Given that Ms. Mclees is a silent carrier (aa/a-), her pregnancies would not be at increased risk for Hb Barts, even if her partner is a carrier for alpha-thalassemia, as she will always pass on at least one copy of the alpha globin gene to her children. Hemoglobin H (HbH) disease is caused by three deleted or dysfunctioning alpha globin alleles (a-/--) and is characterized by microcytic hypochromic hemolytic anemia, hepatosplenomegaly, mild jaundice, growth retardation, and sometimes thalassemia-like bone changes. Given Ms. Renstrom silent carrier status (aa/a-), the current fetus would only be at risk for HbH disease (a-/--), if her partner is a carrier for two alpha globin mutations in cis (aa/--). If this is the case, the risk for HbH disease in the pregnancy would be 1 in 4 (25%). If he is a carrier of alpha-thalassemia in trans, then the pregnancy would not be at increased risk for HbH disease.   Hemoglobin E:  Ms. Mikula was also identified as a carrier for hemoglobin  E disease on Horizon-14 carrier screening. Hemoglobin E is a variant form of hemoglobin caused by a specific mutation in the HBB gene. The HBB gene encodes for  a protein called beta-globin, which is a subunit of hemoglobin. Hemoglobin within red blood cells binds to oxygen molecules in the lungs and delivers oxygen to tissues throughout the body. There are many different types of hemoglobin, with hemoglobin A being the most common form. Mutations in the HBB gene cause variant forms of hemoglobin to be produced, such as hemoglobin C, hemoglobin E, and hemoglobin S. Individuals with hemoglobin E trait are carriers for hemoglobin E disease (HbE disease), whereas individuals with hemoglobin S trait are carriers for sickle cell disease. Since Ms. 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 Hemoglobin E trait is common in individuals with southeast Asian ancestry.   Individuals with HbE disease have red blood cells that contain mostly hemoglobin E. An excess of hemoglobin E can reduce the number and size of red blood cells in the body, causing very mild anemia. We discussed that individuals with HbE disease are usually completely asymptomatic. However, symptoms such as fatigue, growth failure, shortness of breath, and jaundice have rarely been reported. HbE disease is inherited in an autosomal recessive pattern, where both parents must carry hemoglobin E trait to be at risk of having an affected child. If Ms. Winkel partner were also a carrier of HbE disease, they would have a 1 in 4 (25%) chance of having a child with HbE disease.    We also discussed that it is possible that Ms. Vlachos partner could carry another variant form of hemoglobin, such as hemoglobin S. If he did, the couple would have a 1 in 4 (25%) chance of having a child with hemoglobin sickle E disease (HbSE disease). This condition is characterized by mild-moderate anemia, usually without vasoocculsive complications. However, fatigue, weakness, pain crises, infection susceptibility, splenomegaly, splenic infarction, and acute chest syndrome have all rarely been  reported.   Finally, Ms. Locurto partner may be a carrier of a condition called beta-thalassemia. If he is, the couple would have a 1 in 4 (25%) chance of having a child with hemoglobin E/beta-thalassemia (EB) disease. EB disease is the most common severe form of beta-thalassemia in individuals of Asian descent. Globally, it comprises 50% of clinically severe beta-thalassemia disorders. Symptoms and severity of the condition vary between affected individuals. Individuals with severe EB disease can have symptoms similar to severe beta-thalassemia, such as anemia, splenomegaly, jaundice, growth restriction, and facial deformities. Individuals with the severe form of EB disease often require frequent blood transfusions. Other individuals may be mildly affected and able to grow and develop normally without the need for blood transfusions. Most individuals with EB disease have moderate-severe symptoms. Given his ancestry, Ms. Stlaurent partner has a 1 in 2 chance of carrying hemoglobin E trait, a 1 in 810 chance of carrying hemoglobin S trait, and a 1 in 25 chance of carrying beta-thalassemia.   Other carrier screening results:  Ms. Boffa carrier screening was negative for the other 12 conditions screened. Thus, her risk to be a carrier for these additional conditions (listed separately in the laboratory report) has been reduced but not eliminated. This also significantly reduces her risk of having a child affected by one of these conditions. We discussed that carrier testing for thalassemias and hemoglobinopathies is recommended for Ms. Murrill partner. Ms. Sparlin and her partner indicated that they are not interested in  pursuing partner carrier screening.  Aneuploidy screening results:  We also reviewed that Ms. Haslip had Panorama NIPS through the laboratory [name] that was low-risk for fetal aneuploidies. We reviewed that these results showed a less than 1 in 10,000 risk for trisomies 21, 18 and 13, and monosomy X (Turner  syndrome).  In addition, the risk for triploidy and sex chromosome trisomies (47,XXX and 47,XXY) was also low. Ms. Rita elected to have cfDNA analysis for 22q11.2 deletion syndrome, which was also low risk (1 in 2900). We reviewed that while this testing identifies 94-99% of pregnancies with trisomy 35, trisomy 66, trisomy 59, sex chromosome aneuploidies, and triploidy, it is NOT diagnostic. A positive test result requires confirmation by CVS or amniocentesis, and a negative test result does not rule out a fetal chromosome abnormality. She also understands that this testing does not identify all genetic conditions.  Diagnostic testing:  Ms. Wiedman was also counseled regarding diagnostic testing via amniocentesis. We discussed the technical aspects of the procedure and quoted up to a 1 in 500 (0.2%) risk for preterm labor or other adverse pregnancy outcomes as a result of amniocentesis. Cultured cells from an amniocentesis sample allow for the visualization of a fetal karyotype, which can detect >99% of large chromosomal aberrations. Chromosomal microarray can also be performed to identify smaller deletions or duplications of fetal chromosomal material. Amniocentesis could also be performed to assess whether the baby is affected by thalassemias or hemoglobinopathies. After careful consideration, Ms. Gellner declined amniocentesis at this time. She understands that amniocentesis is available at any point after 16 weeks of pregnancy and that she may opt to undergo the procedure at a later date should she change her mind.  Plan:  Additional screening and diagnostic testing were declined today. Ms. Amster understands that screening tests, including ultrasound, cannot rule out all birth defects or genetic syndromes. The patient was advised of this limitation and states she still does not want additional testing or screening at this time.   I counseled Ms. Bolyard regarding the above risks and available options. Second year [employer]  genetic counseling student Jacklynn Lewis participated in portions of this session under my supervision. The approximate face-to-face time with the genetic counselor was 35 minutes.  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

## 2020-08-02 ENCOUNTER — Ambulatory Visit (INDEPENDENT_AMBULATORY_CARE_PROVIDER_SITE_OTHER): Payer: Medicaid Other | Admitting: Obstetrics and Gynecology

## 2020-08-02 ENCOUNTER — Other Ambulatory Visit: Payer: Self-pay

## 2020-08-02 ENCOUNTER — Encounter: Payer: Self-pay | Admitting: Obstetrics and Gynecology

## 2020-08-02 VITALS — BP 113/79 | HR 93 | Wt 160.1 lb

## 2020-08-02 DIAGNOSIS — Z3A29 29 weeks gestation of pregnancy: Secondary | ICD-10-CM

## 2020-08-02 DIAGNOSIS — D582 Other hemoglobinopathies: Secondary | ICD-10-CM | POA: Insufficient documentation

## 2020-08-02 DIAGNOSIS — Z8611 Personal history of tuberculosis: Secondary | ICD-10-CM

## 2020-08-02 DIAGNOSIS — Z789 Other specified health status: Secondary | ICD-10-CM

## 2020-08-02 DIAGNOSIS — Z3480 Encounter for supervision of other normal pregnancy, unspecified trimester: Secondary | ICD-10-CM

## 2020-08-02 DIAGNOSIS — D563 Thalassemia minor: Secondary | ICD-10-CM

## 2020-08-02 DIAGNOSIS — Z98891 History of uterine scar from previous surgery: Secondary | ICD-10-CM

## 2020-08-02 DIAGNOSIS — O34219 Maternal care for unspecified type scar from previous cesarean delivery: Secondary | ICD-10-CM

## 2020-08-02 NOTE — Progress Notes (Signed)
Pt presents for ROB reports no complaints today.  

## 2020-08-02 NOTE — Progress Notes (Signed)
   PRENATAL VISIT NOTE  Subjective:  Jillian Browning is a 29 y.o. G3P2002 at [redacted]w[redacted]d being seen today for ongoing prenatal care.  She is currently monitored for the following issues for this high-risk pregnancy and has Alpha thalassemia silent carrier; Language barrier affecting health care; History of tuberculosis; Previous cesarean section; VBAC (vaginal birth after Cesarean); Encounter for supervision of normal pregnancy, antepartum; and Hemoglobin E trait (HCC) on their problem list.  Patient reports no complaints.  Contractions: Not present. Vag. Bleeding: None.  Movement: Present. Denies leaking of fluid.   The following portions of the patient's history were reviewed and updated as appropriate: allergies, current medications, past family history, past medical history, past social history, past surgical history and problem list.   Objective:   Vitals:   08/02/20 0859  BP: 113/79  Pulse: 93  Weight: 160 lb 1.6 oz (72.6 kg)    Fetal Status: Fetal Heart Rate (bpm): 145 Fundal Height: 30 cm Movement: Present     General:  Alert, oriented and cooperative. Patient is in no acute distress.  Skin: Skin is warm and dry. No rash noted.   Cardiovascular: Normal heart rate noted  Respiratory: Normal respiratory effort, no problems with respiration noted  Abdomen: Soft, gravid, appropriate for gestational age.  Pain/Pressure: Absent     Pelvic: Cervical exam deferred        Extremities: Normal range of motion.  Edema: Trace  Mental Status: Normal mood and affect. Normal behavior. Normal judgment and thought content.   Assessment and Plan:  Pregnancy: G3P2002 at [redacted]w[redacted]d 1. [redacted] weeks gestation of pregnancy   2. Supervision of other normal pregnancy, antepartum   3. Hemoglobin E trait (HCC) - Genetic counseling completed. - Patient declined testing of the FOB and amniocentesis.    4. Alpha thalassemia silent carrier - Genetic counseling completed. - Patient declined testing of the FOB and  amniocentesis.    5. Previous cesarean section - Performed in [place].  No records.  6. History of VBAC - History of successful VBAC.  7. History of tuberculosis - Treated in 2016 in [place].  8. Desires VBAC (vaginal birth after cesarean) trial - Risks and benefits discussed with the patient and her husband via the interpreter. - Consent signed.    9. Language barrier affecting health care - Virtual interpreter used for the visit.    Preterm labor symptoms and general obstetric precautions including but not limited to vaginal bleeding, contractions, leaking of fluid and fetal movement were reviewed in detail with the patient. Please refer to After Visit Summary for other counseling recommendations.   Return in about 2 weeks (around 08/16/2020) for ROB.  No future appointments.  Johnny Bridge, MD

## 2020-08-16 ENCOUNTER — Encounter: Payer: Medicaid Other | Admitting: Nurse Practitioner

## 2020-08-17 ENCOUNTER — Encounter: Payer: Self-pay | Admitting: Nurse Practitioner

## 2020-08-17 ENCOUNTER — Other Ambulatory Visit: Payer: Self-pay

## 2020-08-17 ENCOUNTER — Ambulatory Visit (INDEPENDENT_AMBULATORY_CARE_PROVIDER_SITE_OTHER): Payer: Medicaid Other | Admitting: Nurse Practitioner

## 2020-08-17 VITALS — BP 112/77 | HR 102 | Wt 157.4 lb

## 2020-08-17 DIAGNOSIS — Z7189 Other specified counseling: Secondary | ICD-10-CM

## 2020-08-17 DIAGNOSIS — Z98891 History of uterine scar from previous surgery: Secondary | ICD-10-CM

## 2020-08-17 DIAGNOSIS — Z3A32 32 weeks gestation of pregnancy: Secondary | ICD-10-CM

## 2020-08-17 DIAGNOSIS — Z3483 Encounter for supervision of other normal pregnancy, third trimester: Secondary | ICD-10-CM

## 2020-08-17 DIAGNOSIS — Z3480 Encounter for supervision of other normal pregnancy, unspecified trimester: Secondary | ICD-10-CM

## 2020-08-17 NOTE — Progress Notes (Signed)
ROB  T-Dap received 07/19/20.  CC: None

## 2020-08-17 NOTE — Progress Notes (Signed)
    Subjective:  Jillian Browning is a 29 y.o. G3P2002 at [redacted]w[redacted]d being seen today for ongoing prenatal care.  She is currently monitored for the following issues for this high-risk pregnancy and has Alpha thalassemia silent carrier; Language barrier affecting health care; History of tuberculosis; Previous cesarean section; VBAC (vaginal birth after Cesarean); Encounter for supervision of normal pregnancy, antepartum; and Hemoglobin E trait (HCC) on their problem list.  Patient reports no complaints.  Contractions: Irritability. Vag. Bleeding: None.  Movement: Present. Denies leaking of fluid.   The following portions of the patient's history were reviewed and updated as appropriate: allergies, current medications, past family history, past medical history, past social history, past surgical history and problem list. Problem list updated.  Objective:   Vitals:   08/17/20 0831  BP: 112/77  Pulse: (!) 102  Weight: 157 lb 6.4 oz (71.4 kg)    Fetal Status: Fetal Heart Rate (bpm): 156   Movement: Present     General:  Alert, oriented and cooperative. Patient is in no acute distress.  Skin: Skin is warm and dry. No rash noted.   Cardiovascular: Normal heart rate noted  Respiratory: Normal respiratory effort, no problems with respiration noted  Abdomen: Soft, gravid, appropriate for gestational age. Pain/Pressure: Present     Pelvic:  Cervical exam deferred        Extremities: Normal range of motion.  Edema: Trace  Mental Status: Normal mood and affect. Normal behavior. Normal judgment and thought content.   Urinalysis:      Assessment and Plan:  Pregnancy: G3P2002 at [redacted]w[redacted]d  1. Supervision of other normal pregnancy, antepartum Return in 3 weeks due to holiday this month Doing well - baby moving Video interpreter used for the entire visit  2. History of VBAC Plans VBAC again  3. Previous cesarean section   COVID-19 Vaccine Counseling: The patient was counseled on the potential benefits and  lack of known risks of COVID vaccination, during pregnancy and breastfeeding, during today's visit. The patient's questions and concerns were addressed today, including safety of the vaccination and potential side effects as they have been published by ACOG and [facility]. The patient has been informed that there have not been any documented vaccine related injuries, deaths or birth defects to infant or mom after receiving the COVID-19 vaccine to date. The patient has been made aware that although she is not at increased risk of contracting COVID-19 during pregnancy, she is at increased risk of developing severe disease and complications if she contracts COVID-19 while pregnant. All patient questions were addressed during our visit today. The patient is not planning to get vaccinated at this time.    Preterm labor symptoms and general obstetric precautions including but not limited to vaginal bleeding, contractions, leaking of fluid and fetal movement were reviewed in detail with the patient. Please refer to After Visit Summary for other counseling recommendations.  Return in about 3 weeks (around 09/07/2020) for in person ROB.  Nolene Bernheim, RN, MSN, NP-BC Nurse Practitioner, [facility] for [employer], [facility] 08/17/2020 8:57 AM

## 2020-08-17 NOTE — Patient Instructions (Signed)
Check out Covid vaccine info here:   ttps://covid19.ncdhhs.gov/vaccines  7 Things You Should Know About the COVID-19 Vaccines  Here are seven facts you should know before taking your shot:  1.  No serious side effects were reported in clinical trials. Temporary reactions after receiving the vaccine may include a sore arm, headache, feeling tired and achy for a day or two or, in some cases, fever. In most cases, these reactions are good signs that your body is building protection.   2.  Scientists had a head start. They are built on decades of research on vaccines for similar viruses. A big investment of resources and focus made sure they were created without skipping any steps in development, testing, or clinical trials.  3. You cannot get COVID-19 from the vaccine. The vaccine gives your body instructions to make a protein that safely teaches you to make germ-fighting antibodies to fight the real COVID-19.  4.The vaccine protects against the Delta variant. The Delta variant, which is now predominant in [place], is much more contagious than the original virus. Vaccines continue to be remarkably effective in reducing risk of severe disease, hospitalization, and death, even against the Delta variant.  5. A hundred million people in the [place] have already received their COVID-19 vaccine.  6. It works. And once you're fully vaccinated you're protected. The vaccines are proven to help prevent COVID-19 and are effective in preventing hospitalization and death.   7. The vaccine does not affect fertility. Vaccination for those who are pregnant or wanting to become pregnant is recommended by the American College of Obstetricians and Gynecologists (ACOG), the Society for Maternal-Fetal Medicine (SMFM), the American Society for Reproductive Medicine (ASRM), and the Society for Female Reproduction and Urology.  

## 2020-09-06 ENCOUNTER — Ambulatory Visit (INDEPENDENT_AMBULATORY_CARE_PROVIDER_SITE_OTHER): Payer: Medicaid Other | Admitting: Advanced Practice Midwife

## 2020-09-06 ENCOUNTER — Other Ambulatory Visit: Payer: Self-pay

## 2020-09-06 VITALS — BP 122/77 | HR 87 | Wt 166.0 lb

## 2020-09-06 DIAGNOSIS — Z98891 History of uterine scar from previous surgery: Secondary | ICD-10-CM

## 2020-09-06 DIAGNOSIS — Z3480 Encounter for supervision of other normal pregnancy, unspecified trimester: Secondary | ICD-10-CM

## 2020-09-06 DIAGNOSIS — Z789 Other specified health status: Secondary | ICD-10-CM

## 2020-09-06 DIAGNOSIS — Z3A34 34 weeks gestation of pregnancy: Secondary | ICD-10-CM

## 2020-09-06 NOTE — Progress Notes (Signed)
Pt is doing well.

## 2020-09-06 NOTE — Progress Notes (Signed)
   PRENATAL VISIT NOTE  Subjective:  Jillian Browning is a 29 y.o. G3P2002 at [redacted]w[redacted]d being seen today for ongoing prenatal care.  She is currently monitored for the following issues for this low-risk pregnancy and has Alpha thalassemia silent carrier; Language barrier affecting health care; History of tuberculosis; Previous cesarean section; VBAC (vaginal birth after Cesarean); Encounter for supervision of normal pregnancy, antepartum; and Hemoglobin E trait (HCC) on their problem list.  Patient reports occasional contractions.  Contractions: Not present. Vag. Bleeding: None.  Movement: Present. Denies leaking of fluid.   The following portions of the patient's history were reviewed and updated as appropriate: allergies, current medications, past family history, past medical history, past social history, past surgical history and problem list.   Objective:   Vitals:   09/06/20 0826  BP: 122/77  Pulse: 87  Weight: 166 lb (75.3 kg)    Fetal Status: Fetal Heart Rate (bpm): 145   Movement: Present     General:  Alert, oriented and cooperative. Patient is in no acute distress.  Skin: Skin is warm and dry. No rash noted.   Cardiovascular: Normal heart rate noted  Respiratory: Normal respiratory effort, no problems with respiration noted  Abdomen: Soft, gravid, appropriate for gestational age.  Pain/Pressure: Present     Pelvic: Cervical exam deferred        Extremities: Normal range of motion.     Mental Status: Normal mood and affect. Normal behavior. Normal judgment and thought content.   Assessment and Plan:  Pregnancy: G3P2002 at [redacted]w[redacted]d 1. Supervision of other normal pregnancy, antepartum --Anticipatory guidance about next visits/weeks of pregnancy given. --Next visit in 2 weeks in office for GBS  2. History of VBAC --Desires VBAC this pregnancy, consent signed.  C/S with first then successful VBAC with last pregnancy  3. [redacted] weeks gestation of pregnancy   4. Language barrier affecting  health care --[place] interpreter used for all communication  Preterm labor symptoms and general obstetric precautions including but not limited to vaginal bleeding, contractions, leaking of fluid and fetal movement were reviewed in detail with the patient. Please refer to After Visit Summary for other counseling recommendations.   Return in about 2 weeks (around 09/20/2020).  No future appointments.  [name], CNM

## 2020-09-06 NOTE — Patient Instructions (Signed)
Ba thng th? ba c?a thai k? Third Trimester of Pregnancy Ba thng th? ba l t? tu?n 28 ??n tu?n 40 (thng th? 7 ??n thng th? 9). Ba thng th? ba c?ng l th?i gian khi em b trong b?ng m? (bo thai) ?ang pht tri?n nhanh chng. Vo cu?i thng th? chn, bo thai di kho?ng 20 in s? v n?ng kho?ng 6-10 pao. Cc thay ??i c? th? trong ba thng th? ba c?a thai k? C? th? c?a qu v? s? ti?p t?c tr?i qua nhi?u thay ??i trong th?i gian mang thai. Nh?ng thay ??i khc nhau ? cc ph? n? s? khc nhau. Trong ba thng th? ba:  Cn n?ng c?a qu v? s? ti?p t?c t?ng. Qu v? c th? t?ng ???c 25-35 pao (11-16 kg) vo cu?i thai k?.  Qu v? c th? b?t ??u c cc v?t r?n trn hng, b?ng v v.  Qu v? c th? ?i ti?u th??ng xuyn h?n v bo thai di chuy?n xu?ng th?p h?n vo khung ch?u c?a qu v? v ? vo bng quang c?a qu v?.  Qu v? c th? b? ho?c ti?p t?c b? ? nng. Nguyn nhn c?a tnh tr?ng ny l do cc hc-mn t?ng lm cc c? ? ???ng tiu ha ho?t ??ng ch?m l?i.  Qu v? c th? pht tri?n ho?c ti?p t?c b? to bn do t?ng cc hc-mn lm ch?m qu trnh tiu ha v lm cc c? c ch?c n?ng ??y ch?t th?i qua ???ng ru?t y?u ?i.  Qu v? c th? b? tr?. Nh?ng t?nh m?ch b? s?ng (gin t?nh m?ch) trong tr?c trng c th? gy ng?a ho?c gy ?au ??n.  Qu v? c th? b? s?ng, phnh t?nh m?ch (gin t?nh m?ch) ? chn.  Qu v? c th? b? ?au nh?c c? th? nhi?u h?n ? khung ch?u, l?ng ho?c ?i. V?n ?? ny l do t?ng cn v t?ng cc hc-mn lm th? gic cc kh?p.  Qu v? c nh?ng thay ??i v? tc. Nh?ng thay ??i ny c th? bao g?m tc dy ln, tc m?c nhanh h?n v thay ??i v? k?t c?u tc. M?t s? ph? n? c?ng b? r?ng tc trong khi ho?c sau khi mang thai, ho?c c?m th?y tc kh ho?c m?ng. Tc c?a qu v? s? c nhi?u kh? n?ng s? tr? l?i bnh th??ng sau khi em b ???c sinh ra.  V c?a qu v? s? ti?p t?c pht tri?n v ti?p t?c tr? nn nh?y c?m ?au. Ch?t d?ch mu vng (s?a non) c th? r? ra ? v c?a qu v?. ?y l lo?i s?a ??u tin m c? th?  qu v? s?n sinh ra cho em b.  R?n c?a qu v? c th? l?i ra.  Quy? vi? c th? th?y s?ng t?ng ln ? bn tay, m?t ho?c m?t c chn.  Qu v? c th? t?ng c?m gic ?au bu?t ho?c t ? bn tay, cnh tay v chn. Da ? b?ng qu v? c?ng c th? c?m th?y t b.  Qu v? c th? c?m th?y kh th? v t? cung to ra.  Qu v? c th? b? kh ng? nhi?u h?n. Nguyn nhn l do kch th??c b?ng c?a qu v?, t?ng nhu c?u ti?u ti?n v t?ng qu trnh chuy?n ha c?a c? th?.  Qu v? c th? nh?n th?y thai nhi "r?i xu?ng", ho?c di chuy?n xu?ng th?p h?n trong b?ng c?a qu v? (sa b?ng).  Qu v? c th? ra kh h? ? m ??o nhi?u   [place]?n.  Qu v? c th? th?y cc kh?p c?a qu v? c v? l?ng l?o v c th? b? ?au xung quanh x??ng ch?u. Nh?ng g d? ki?n s? x?y ra ? cc l?n khm tr??c khi sinh Qu v? s? ???c khm tr??c khi sinh 2 tu?n m?t l?n cho ??n tu?n 36. Sau ?, qu v? s? ???c khm Jenilleng tu?n tr??c khi sinh. Trong m?t l?n khm tr??c khi sinh th??ng quy:  Qu v? s? ???c cn ?? ??m b?o qu v? v thai nhi ?ang pht tri?n bnh th??ng.  Qu v? s? ???c ?o huy?t p.  Qu v? s? ???c ?o vng b?ng ?? theo di s? pht tri?n c?a b.  Nh?p tim thai s? ???c nghe.  B?t k? k?t qu? ki?m tra no t? l?n khm tr??c ? c?ng s? ???c th?o lu?n.  Qu v? c th? ???c ki?m tra c? t? cung g?n ngy d? sinh ?? xem c? t? cung ? m?m ho?c m?ng ch?a (m? ra).  Qu v? s? ???c xt nghi?m tm lin c?u khu?n Nhm B. Vi?c ny x?y ra trong kho?ng t? tu?n 35 ??n 37. Chuyn gia ch?m sc s?c kh?e c th? Jayah?i qu v?:  K? ho?ch sinh c?a qu v? l g.  Qu v? c?m th?y th? no.  Qu v? c c?m th?y em b c? ??ng hay khng.  Qu v? c b?t k? tri?u ch?ng b?t th??ng no, ch?ng Tram?n nh? ch?y d?ch, ch?y mu, ?au ??u r?t nhi?u ho?c co th?t ? b?ng hay khng.  Qu v? c ?ang s? d?ng b?t k? s?n ph?m thu?c l no, bao g?m thu?c l d?ng Quierrat, thu?c l d?ng nhai v thu?c l ?i?n t? hay khng.  Qu v? c b?t k? cu Kai?i no hay khng. Nh?ng ki?m tra ho?c sng l?c khc c th? ???c  th?c hi?n trong ba thng th? hai c?a qu v? bao g?m:  Cc xt nghi?m mu ki?m tra n?ng ?? s?t th?p (thi?u mu).  Ki?m tra thai ?? ki?m tra s?c kh?e, m?c ?? ho?t ??ng v s? pht tri?n c?a thai nhi. Vi?c ki?m tra ???c th?c hi?n n?u qu v? c m?t s? tnh tr?ng b?nh l nh?t ??nh, ho?c n?u c cc v?n ?? trong qu trnh mang thai.  Th? nghi?m khng g?ng s?c (NST). Th? nghi?m ny ki?m tra s?c kh?e c?a em b ?? ??m b?o khng c d?u hi?u c?a cc v?n ??, ch?ng Terita?n nh? em b khng nh?n ??  xi. Trong th? nghi?m ny, m?t d?i b?ng ???c qu?n xung quanh b?ng qu v?. Em b s? ???c lm cho di chuy?n v nh?p tim s? ???c theo di trong khi di chuy?n. Chuy?n d? gi? l g? Chuy?n d? gi? l tnh tr?ng trong ? qu v? c?m th?y cc c?n co c? nh?, khng ??u ? t? cung (co bp) th??ng Earlean?t khi ngh? ng?i, thay ??i t? th? ho?c u?ng n??c. Cc c?n co ny ???c g?i l c?n co Braxton Hicks. Cc c?n co c th? ko di trong nhi?u gi?, nhi?u ngy, ho?c th?m ch nhi?u tu?n tr??c khi chuy?n d? th?t. N?u cc c?n co xu?t hi?n theo cc kho?ng ??nh k?, tr? nn th??ng xuyn Juliette?n, t?ng c??ng ??, ho?c gy ?au, qu v? c?n ?i g?p chuyn gia ch?m sc s?c kh?e. Cc d?u hi?u chuy?n d? l g?  Co th?t ? b?ng.  Co th?t ??nh k? b?t ??u cch nhau 10 pht v tr? nn m?nh S[place]onya?n v th??ng xuyn Rashay?n   theo th?i gian.  Cc c?n co b?t ??u t? ??nh t? cung v lan xu?ng b?ng d??i v l?ng.  T?ng p l?c ln khung ch?u v ?au l?ng m ?.  Ti?t d?ch nh?y ton n??c ho?c l?n mu t? m ??o.  R r? n??c ?i. Hi?n t??ng ny c?ng ???c g?i l "v? ?i." N??c ?i c th? ra thnh dng ch?y ch?m ho?c phun ? ?t. Hy cho chuyn gia ch?m [place] s?c kh?e bi?t n?u n??c ?i c mu ho?c mi l?. N?u qu v? c b?t k? d?u hi?u no trong s? cc d?u hi?u ny, hy g?i ngay cho chuyn gia ch?m [place] s?c kh?e, ngay c? khi ch?a ??n ngy d? sinh. Tun th? nh?ng h??ng d?n ny ? nh: Thu?c  Tun th? cc ch? d?n c?a chuyn gia ch?m [place] s?c kh?e v? vi?c s? d?ng thu?c. M?t s? lo?i thu?c c? th? co? th?  an ton ho?c khng an ton [place] dng trong qu trnh [place].  U?ng vitamin tr??c khi sinh c t nh?t 600 microgram (mcg) axit folic.  N?u quy? vi? bi? ta?o bo?n, hy th? dng thu?c lm m?m phn n?u chuyn gia ch?m [place] s?c kh?e c?a qu v? ch?p thu?n. ?n v u?ng   ?n ch? ?? ?n cn b?ng bao g?m tri cy t??i v rau c?, ng? c?c nguyn cm, cc ngu?n protein t?t nh? th?t, tr?ng, ??u h? v s?a t bo. Chuyn gia ch?m [place] s?c kh?e c?a quy? vi? s? gip quy? vi? xc ??nh m??c t?ng cn phu? h??p cho quy? vi?.  Hessie Diener th?t s?ng v pho mt ch?a n?u chn. Nh?ng th? ny mang m?m b?nh c th? gy d? t?t b?m sinh cho em b.  N?u qu v? s? d?ng l??ng canxi th?p t? th?c ph?m, hy trao ??i v?i chuyn gia ch?m [place] s?c kh?e v? vi?c qu v? c c?n dng th?c ph?m b? sung canxi hng ngy hay khng.  ?n b?n ho?c n?m b?a ?n nh? thay v ba b?a l?n m?i ngy.  H?n ch? cc lo?i th?c ?n giu ch?t bo v ???ng tinh luy?n, ch?ng h?n nh? ?? ?n chin rn v ?? ng?t.  ?? ng?n ng?a to bn: ? U?ng ?? n??c ?? gi? cho n??c ti?u trong ho?c c mu vng nh?t. ? ?n th?c ?n giu ch?t x? nh? tri cy t??i v rau, ng? c?c nguyn h?t v cc lo?i ??u. Ho?t ??ng  Ch? t?p th? d?c theo ch? d?n c?a chuyn gia ch?m [place] s?c kh?e. H?u h?t ph? n? c th? ti?p t?c ch? ?? t?p luy?n bnh th??ng c?a h? trong khi mang [place]. C? g??ng t?p th? du?c 30 phu?t m?i nga?y, t nh?t 5 nga?y m?i tu?n. Ng?ng t?p th? d?c n?u qu v? c cc c?n co t? cung.  Trnh nng v?t n?ng.  Khng t?p th? d?c ? mi tr??ng qu nng ho?c ?m ho?c ? ?? cao l?n.  Mang gi?y gt th?p, tho?i mi.  Th?c hnh t? th? t?t.  Quy? vi? c th? ti?p t?c quan h? tnh d?c tr? khi chuyn gia ch?m [place] s?c kh?e ni khc. Gi?m ?au v gi?m c?m gic kh ch?u  Ngh? ng?i th??ng xuyn v nng cao chn trong khi ngh? n?u qu v? b? chu?t rt chn ho?c ?au th?t l?ng.  T?m b?n ng?i n??c ?m ?? lm d?u ?au ho?c d?u c?m gic kh ch?u do b?nh tr? gy ra. S? d?ng kem ?i?u tr? tr? n?u chuyn gia  ch?m  s?c  kh?e ch?p Roselin?n.  M?c m?t chi?c o ng?c nng ?? v hi?u qu? ?? ng?n ng?a c?m gic kh ch?u do v b? nh?y c?m ?au.  N?u qu v? b? gin t?nh m?ch: ? M?c qu?n n?t nng b?ng ho?c ?i t?t p theo ch? d?n c?a chuyn gia ch?m sc s?c kh?e. ? Nng cao chn c?a qu v? trong 15 pht, 3-4 l?n m?t ngy. Ch?m sc tr???c khi sinh  Vi?t ra cu h?i c?a quy? vi?. ?em ca?c cu ho?i ??n ca?c bu?i kha?m tr???c khi sinh.  Tun th? t?t c? cc cu?c h?n khm tr??c khi sinh theo ch? d?n c?a chuyn gia ch?m sc s?c kh?e. ?i?u ny c vai tr quan tr?ng. An ton  Lun ?eo dy th?t an ton c?a qu v? khi li xe.  L?p m?t danh sch cc s? ?i?n tho?i c?p c?u, bao g?m s? ?i?n thoa?i gia ?nh, b?n b, b?nh vi?n v c?nh st v s? c?u h?a. H??ng d?n chung  Trnh h?p v? sinh c?a mo v ??t v? sinh dnh cho mo. Nh?ng th? ny mang m?m b?nh c th? gy d? t?t b?m sinh cho em b. N?u qu v? nui mo, hy nh? m?t ai ? d?n d?p h?p v? sinh thay cho qu v?.  Khng ?i du l?ch xa tr? khi vi?c ? tuy?t ??i c?n thi?t v ch? ?i khi c s? ch?p Aileena?n c?a chuyn gia ch?m sc s?c kh?e.  Khng s? d?ng b?n t?m n??c nng, phng xng h?i, ho?c nh t?m h?i.  Khng u?ng r??u.  Khng s? d?ng b?t k? s?n ph?m no ch?a nicotine ho?c [place]?c l, ch?ng ha?n nh? Khadeeja?c l d?ng ht v [place]?c l ?i?n t?. N?u qu v? c?n gip ?? ?? cai Hortencia?c, hy h?i chuyn gia ch?m sc s?c kh?e.  Khng s? d?ng b?t k? th?o d??c hay Ellanor?c khng k ??n no. Cc ha ch?t ny ?nh h??ng ??n s? hnh thnh v pht tri?n c?a em b.  Khng Layni?t r??a ho?c s? d?ng nu?t b?ng v? sinh ho?c b?ng v? sinh c mi th?m.  Khng b??t cho chn trong th?i gian di.  ?? chu?n b? cho s? ra ??i c?a em b: ? Tham gia cc l?p h?c tr??c khi sinh ?? hi?u, th?c hnh v ??t cu h?i v? tr? d? v sinh con. ? Th?c hi?n m?t l?n th? t?i b?nh vi?n. ? Gh th?m b?nh vi?n v ?i quanh khu v?c dnh cho s?n ph?. ? S?p x?p ngh? ?? ho?c ngh? lm cha thng qua s? cho php ch? lao ??ng. ? S?p  x?p ?? gia ?nh v b?n b ch?m sc cho th c?ng trong khi qu v? ? b?nh vi?n. ? Mua m?t chi?c gh? ng?i  t quay m?t v? sau v ??m b?o qu v? bi?t cch l?p n vo trong xe. ? Chu?n b? ti ?? ?? ?i vi?n. ? Chu?n b? phng cho em b. ??m b?o lo?i b? t?t c? g?i v th nh?i bng kh?i c?i c?a em b ?? phng ng?a ng?t th?.  ??n khm nha s? n?u qu v? ch?a ??n trong th?i gian qu v? mang thai. S? d?ng m?t bn ch?i m?m ?? ?nh r?ng c?a qu v? v hy nh? nhng khi dng ch? nha khoa. Hy lin l?c v?i chuyn gia ch?m sc s?c kh?e n?u:  Qu v? khng ch?c ch?n li?u qu v? c tr? d? ho?c li?u qu v? ? v? ?i hay ch?a.  Qu v? b? chng m?t.  Qu   v? b? co th?t nh? ? vng ch?u, t?c n?ng ? vng ch?u, ?au m ? ? vng b?ng c?a qu v?.  Quy? vi? bi? ?au th?t l?ng.  Qu v? b? bu?n nn, nn m?a ho?c tiu ch?y lin t?c.  Qu v? c d?ch m ??o b?t th??ng ho?c mi hi.  Qu v? b? ?au khi ?i ti?u. Yu c?u tr? gip ngay l?p t?c n?u:  Qu v? b? v? ?i tr??c 37 tu?n.  Qu v? b? cc c?n co ??nh k? cch nhau ch?a ??n 5 pht tr??c 37 tu?n.  Qu v? b? s?t.  Qu v? b? r r? d?ch ? m ??o.  Qu v? c ra ??m mu ho?c ch?y mu ? m ??o.  Qu v? b? co th?t ho?c ?au d? d?i ? b?ng.  Qu v? s?t cn ho?c t?ng cn nhanh.  Qu v? b? kh th? cng v?i ?au ng?c.  Qu v? th?y s?ng ??t ng?t ho?c r?t nhi?u ? m?t, bn tay, m?t c chn, bn chn ho?c chn.  Em b c? ??ng t h?n 10 l?n trong 2 gi?.  Qu v? b? ?au ??u r?t nhi?u m khng h?t sau khi dng thu?c.  Qu v? b? thay ??i th? l?c. Tm t?t  Ba thng th? ba l t? tu?n 28 ??n tu?n 40, thng th? 7 ??n thng th? 9. Ba thng th? ba c?ng l th?i gian khi em b trong b?ng m? (bo thai) ?ang pht tri?n nhanh chng.  Trong ba thng th? ba, c?m gic kh ch?u c?a qu v? c th? t?ng ln khi qu v? v em b ti?p t?c t?ng cn. Qu v? c th? b? ?au b?ng, ?au chn v ?au l?ng, kh ng? v t?ng nhu c?u ti?u ti?n.  Trong ba thng th? ba, ng?c qu v? s? ti?p t?c pht tri?n v ti?p  t?c tr? nn nh?y c?m ?au. Ch?t d?ch mu vng (s?a non) c th? r? ra ? v c?a qu v?. ?y l lo?i s?a ??u tin m c? th? qu v? s?n sinh ra cho em b.  Chuy?n d? gi? l tnh tr?ng m trong ? qu v? c?m th?y cc c?n co c? nh?, khng ??u ? t? cung (co bp) cu?i cng t? h?t. Cc c?n co ny ???c g?i l c?n co Braxton Hicks. Cc c?n co c th? ko di trong nhi?u gi?, nhi?u ngy, ho?c th?m ch nhi?u tu?n tr??c khi chuy?n d? th?t.  Cc d?u hi?u chuy?n d? c th? bao g?m: co th?t ? b?ng; cc c?n co th?t ??nh k? b?t ??u cch nhau 10 pht v tr? nn m?nh h?n v th??ng xuyn h?n theo th?i gian; ti?t d?ch nh?y ton n??c ho?c c mu ? m ??o; t?ng p l?c ln khung ch?u v ?au l?ng m ?; v ch?y n??c ?i. Thng tin ny khng nh?m m?c ?ch thay th? cho l?i khuyn m chuyn gia ch?m sc s?c kh?e ni v?i qu v?. Hy b?o ??m qu v? ph?i th?o lu?n b?t k? v?n ?? g m qu v? c v?i chuyn gia ch?m sc s?c kh?e c?a qu v?. Document Revised: 02/05/2017 Document Reviewed: 02/05/2017 Elsevier Patient Education  2020 [employer] Inc.  

## 2020-09-20 ENCOUNTER — Other Ambulatory Visit (HOSPITAL_COMMUNITY)
Admission: RE | Admit: 2020-09-20 | Discharge: 2020-09-20 | Disposition: A | Discharge status: Home / Self Care | Payer: Medicaid Other | Source: Ambulatory Visit | Attending: Advanced Practice Midwife | Admitting: Advanced Practice Midwife

## 2020-09-20 ENCOUNTER — Ambulatory Visit (INDEPENDENT_AMBULATORY_CARE_PROVIDER_SITE_OTHER): Payer: Medicaid Other | Admitting: Advanced Practice Midwife

## 2020-09-20 ENCOUNTER — Other Ambulatory Visit: Payer: Self-pay

## 2020-09-20 ENCOUNTER — Encounter: Payer: Self-pay | Admitting: Advanced Practice Midwife

## 2020-09-20 VITALS — BP 111/77 | HR 72 | Wt 173.0 lb

## 2020-09-20 DIAGNOSIS — Z3480 Encounter for supervision of other normal pregnancy, unspecified trimester: Secondary | ICD-10-CM | POA: Insufficient documentation

## 2020-09-20 DIAGNOSIS — Z98891 History of uterine scar from previous surgery: Secondary | ICD-10-CM

## 2020-09-20 DIAGNOSIS — Z3A36 36 weeks gestation of pregnancy: Secondary | ICD-10-CM

## 2020-09-20 DIAGNOSIS — Z789 Other specified health status: Secondary | ICD-10-CM

## 2020-09-20 MED ORDER — PRENATAL-U 106.5-1 MG PO CAPS: 1.0000 | ORAL_CAPSULE | Freq: Every day | ORAL | 11 refills | Status: DC

## 2020-09-20 NOTE — Patient Instructions (Signed)
Things to Try After 37 weeks to Encourage Labor/Get Ready for Labor:   1.  Try the [place] at www.milescircuit.com daily to improve baby's position and encourage the onset of labor.  2. Walk a little and rest a little every day.  Change positions often.  3. Cervical Ripening: May try one or both a. Red Raspberry Leaf capsules or tea:  two 300mg or 400mg tablets with each meal, 2-3 times a day, or 1-3 cups of tea daily  Potential Side Effects Of Raspberry Leaf:  Most women do not experience any side effects from drinking raspberry leaf tea. However, nausea and loose stools are possible   b. Evening Primrose Oil capsules: take 1 capsule by mouth and place one capsule in the vagina every night.    Some of the potential side effects:  Upset stomach  Loose stools or diarrhea  Headaches  Nausea  4. Sex can also help the cervix ripen and encourage labor onset.    Labor Precautions Reasons to come to [facility] at [facility]:  1.  Contractions are  5 minutes apart or less, each last 1 minute, these have been going on for 1-2 hours, and you cannot walk or talk during them 2.  You have a large gush of fluid, or a trickle of fluid that will not stop and you have to wear a pad 3.  You have bleeding that is bright red, heavier than spotting--like menstrual bleeding (spotting can be normal in early labor or after a check of your cervix) 4.  You do not feel the baby moving like he/she normally does 

## 2020-09-20 NOTE — Progress Notes (Signed)
° °  PRENATAL VISIT NOTE  Subjective:  Jillian Browning is a 29 y.o. G3P2002 at [redacted]w[redacted]d being seen today for ongoing prenatal care.  She is currently monitored for the following issues for this low-risk pregnancy and has Alpha thalassemia silent carrier; Language barrier affecting health care; History of tuberculosis; Previous cesarean section; VBAC (vaginal birth after Cesarean); Encounter for supervision of normal pregnancy, antepartum; and Hemoglobin E trait (HCC) on their problem list.  Patient reports no complaints.  Contractions: Not present. Vag. Bleeding: None.  Movement: Present. Denies leaking of fluid.   The following portions of the patient's history were reviewed and updated as appropriate: allergies, current medications, past family history, past medical history, past social history, past surgical history and problem list.   Objective:   Vitals:   09/20/20 0834  BP: 111/77  Pulse: 72  Weight: 173 lb (78.5 kg)    Fetal Status: Fetal Heart Rate (bpm): 150 Fundal Height: 35 cm Movement: Present  Presentation: Vertex  General:  Alert, oriented and cooperative. Patient is in no acute distress.  Skin: Skin is warm and dry. No rash noted.   Cardiovascular: Normal heart rate noted  Respiratory: Normal respiratory effort, no problems with respiration noted  Abdomen: Soft, gravid, appropriate for gestational age.  Pain/Pressure: Absent     Pelvic: Cervical exam performed in the presence of a chaperone Dilation: 1 Effacement (%): 50 Station: -2  Extremities: Normal range of motion.  Edema: None  Mental Status: Normal mood and affect. Normal behavior. Normal judgment and thought content.   Assessment and Plan:  Pregnancy: G3P2002 at [redacted]w[redacted]d 1. Supervision of other normal pregnancy, antepartum --Anticipatory guidance about next visits/weeks of pregnancy given. --Next visit in 1 week in office  2. History of VBAC --Successful VBAC x 1, desires VBAC this pregnancy, TOLAC consent signed.  3.  Language barrier affecting health care --[place] interpreter used for all communication.  Term labor symptoms and general obstetric precautions including but not limited to vaginal bleeding, contractions, leaking of fluid and fetal movement were reviewed in detail with the patient. Please refer to After Visit Summary for other counseling recommendations.   Return in about 1 week (around 09/27/2020).  No future appointments.  [name], CNM

## 2020-09-20 NOTE — Progress Notes (Signed)
ROB   GBS due today  Has not been able to pick up PNV Rx for a month. Pt advised Ins may not now cover Rx will get provider to send a different Rx or she may get OTC PNV's

## 2020-09-21 LAB — CERVICOVAGINAL ANCILLARY ONLY
Chlamydia: NEGATIVE
Comment: NEGATIVE
Comment: NEGATIVE
Comment: NORMAL
Neisseria Gonorrhea: NEGATIVE
Trichomonas: NEGATIVE

## 2020-09-22 LAB — STREP GP B NAA: Strep Gp B NAA: NEGATIVE

## 2020-09-27 ENCOUNTER — Other Ambulatory Visit: Payer: Self-pay

## 2020-09-27 ENCOUNTER — Ambulatory Visit (INDEPENDENT_AMBULATORY_CARE_PROVIDER_SITE_OTHER): Payer: Medicaid Other

## 2020-09-27 VITALS — BP 123/80 | HR 86 | Wt 175.2 lb

## 2020-09-27 DIAGNOSIS — Z3A37 37 weeks gestation of pregnancy: Secondary | ICD-10-CM

## 2020-09-27 DIAGNOSIS — O34219 Maternal care for unspecified type scar from previous cesarean delivery: Secondary | ICD-10-CM

## 2020-09-27 DIAGNOSIS — Z3480 Encounter for supervision of other normal pregnancy, unspecified trimester: Secondary | ICD-10-CM

## 2020-09-27 DIAGNOSIS — Z789 Other specified health status: Secondary | ICD-10-CM

## 2020-09-27 NOTE — Patient Instructions (Signed)
Vaginal Birth After Cesarean Delivery  Vaginal birth after cesarean delivery (VBAC) is giving birth vaginally after previously delivering a baby through a cesarean section (C-section). A VBAC may be a safe option for you, depending on your health and other factors. It is important to discuss VBAC with your health care provider early in your pregnancy so you can understand the risks, benefits, and options. Having these discussions early will give you time to make your birth plan. Who are the best candidates for VBAC? The best candidates for VBAC are women who:  Have had one or two prior cesarean deliveries, and the incision made during the delivery was horizontal (low transverse).  Do not have a vertical (classical) scar on their uterus.  Have not had a tear in the wall of their uterus (uterine rupture).  Plan to have more pregnancies. A VBAC is also more likely to be successful:  In women who have previously given birth vaginally.  When labor starts by itself (spontaneously) before the due date. What are the benefits of VBAC? The benefits of delivering your baby vaginally instead of by a cesarean delivery include:  A shorter hospital stay.  A faster recovery time.  Less pain.  Avoiding risks associated with major surgery, such as infection and blood clots.  Less blood loss and less need for donated blood (transfusions). What are the risks of VBAC? The main risk of attempting a VBAC is that it may fail, forcing your health care provider to deliver your baby by a C-section. Other risks are rare and include:  Tearing (rupture) of the scar from a past cesarean delivery.  Other risks associated with vaginal deliveries. If a repeat cesarean delivery is needed, the risks include:  Blood loss.  Infection.  Blood clot.  Damage to surrounding organs.  Removal of the uterus (hysterectomy), if it is damaged.  Placenta problems in future pregnancies. What else should I know  about my options? Delivering a baby through a VBAC is similar to having a normal spontaneous vaginal delivery. Therefore, it is safe:  To try with twins.  For your health care provider to try to turn the baby from a breech position (external cephalic version) during labor.  With epidural analgesia for pain relief. Consider where you would like to deliver your baby. VBAC should be attempted in facilities where an emergency cesarean delivery can be performed. VBAC is not recommended for home births. Any changes in your health or your baby's health during your pregnancy may make it necessary to change your initial decision about VBAC. Your health care provider may recommend that you do not attempt a VBAC if:  Your baby's suspected weight is 8.8 lb (4 kg) or more.  You have preeclampsia. This is a condition that causes high blood pressure along with other symptoms, such as swelling and headaches.  You will have VBAC less than 19 months after your cesarean delivery.  You are past your due date.  You need to have labor started (induced) because your cervix is not ready for labor (unfavorable). Where to find more information  American Pregnancy Association: americanpregnancy.org  American Congress of Obstetricians and Gynecologists: acog.org Summary  Vaginal birth after cesarean delivery (VBAC) is giving birth vaginally after previously delivering a baby through a cesarean section (C-section). A VBAC may be a safe option for you, depending on your health and other factors.  Discuss VBAC with your health care provider early in your pregnancy so you can understand the risks, benefits, options, and   have plenty of time to make your birth plan.  The main risk of attempting a VBAC is that it may fail, forcing your health care provider to deliver your baby by a C-section. Other risks are rare. This information is not intended to replace advice given to you by your health care provider. Make sure  you discuss any questions you have with your health care provider. Document Revised: 01/20/2019 Document Reviewed: 01/01/2017 Elsevier Patient Education  2020 Elsevier Inc.  

## 2020-09-27 NOTE — Progress Notes (Signed)
   LOW-RISK PREGNANCY OFFICE VISIT  Patient name: Jillian Browning MRN [id number]  Date of birth: 03/18/1991 Chief Complaint:   Routine Prenatal Visit  Subjective:   Jillian Browning is a 29 y.o. G36P2002 female at [redacted]w[redacted]d with an Estimated Date of Delivery: 10/12/20 being seen today for ongoing management of a low-risk pregnancy aeb has Alpha thalassemia silent carrier; Language barrier affecting health care; History of tuberculosis; Previous cesarean section; VBAC (vaginal birth after Cesarean); Encounter for supervision of normal pregnancy, antepartum; and Hemoglobin E trait (HCC) on their problem list.  Patient presents today with complaint of no complaints. Patient endorses fetal movement.  Patient denies abdominal cramping or contractions as well as vaginal concerns including abnormal discharge, leaking of fluid, and bleeding.  Contractions: Not present. Vag. Bleeding: None.  Movement: Present.  Reviewed past medical,surgical, social, obstetrical and family history as well as problem list, medications and allergies.  Objective   Vitals:   09/27/20 0828  BP: 123/80  Pulse: 86  Weight: 175 lb 3.2 oz (79.5 kg)  Body mass index is 31.04 kg/m.  Total Weight Gain:50 lb 3.2 oz (22.8 kg)         Physical Examination:   General appearance: Well appearing, and in no distress  Mental status: Alert, oriented to person, place, and time  Skin: Warm & dry  Cardiovascular: Normal heart rate noted  Respiratory: Normal respiratory effort, no distress  Abdomen: Soft, gravid, nontender, AGA with Fundal height of Fundal Height: 36 cm  Pelvic: Cervical exam deferred           Extremities: Edema: None  Fetal Status: Fetal Heart Rate (bpm): 142  Movement: Present   No results found for this or any previous visit (from the past 24 hour(s)).  Assessment & Plan:  Low-risk pregnancy of a 29 y.o., G3P2002 at [redacted]w[redacted]d with an Estimated Date of Delivery: 10/12/20   1. Supervision of other normal pregnancy,  antepartum -Anticipatory guidance for upcoming appts. -Reviewed when and where to go for labor concerns/onset.  2. [redacted] weeks gestation of pregnancy -Doing well overall. -Pediatrician chosen. -Does not desire postpartum birth control. -Plans to bottle feed.  3. Desires VBAC (vaginal birth after cesarean) trial -Consent signed -Informational sheet given 10/26 with Dr. Annye English  4. Language barrier affecting health care -Interpretations completed with assistance of in person [facility] interpreter: Inetta Fermo.       Meds: No orders of the defined types were placed in this encounter.  Labs/procedures today:  Lab Orders  No laboratory test(s) ordered today     Reviewed: Term labor symptoms and general obstetric precautions including but not limited to vaginal bleeding, contractions, leaking of fluid and fetal movement were reviewed in detail with the patient.  All questions were answered.  Follow-up: Return in about 1 week (around 10/04/2020) for LROB.  No orders of the defined types were placed in this encounter.  [name] MSN, CNM 09/27/2020

## 2020-09-27 NOTE — Progress Notes (Signed)
Patient reports fetal movement with occasional back pain. 

## 2020-09-30 ENCOUNTER — Inpatient Hospital Stay (HOSPITAL_COMMUNITY)
Admission: AD | Admit: 2020-09-30 | Discharge: 2020-10-01 | DRG: 807 | Disposition: A | Discharge status: Home / Self Care | Payer: Medicaid Other | Attending: Obstetrics and Gynecology | Admitting: Obstetrics and Gynecology

## 2020-09-30 ENCOUNTER — Inpatient Hospital Stay (HOSPITAL_COMMUNITY): Payer: Medicaid Other | Admitting: Anesthesiology

## 2020-09-30 ENCOUNTER — Encounter (HOSPITAL_COMMUNITY): Payer: Self-pay | Admitting: Obstetrics and Gynecology

## 2020-09-30 ENCOUNTER — Other Ambulatory Visit: Payer: Self-pay

## 2020-09-30 DIAGNOSIS — O34211 Maternal care for low transverse scar from previous cesarean delivery: Secondary | ICD-10-CM | POA: Diagnosis not present

## 2020-09-30 DIAGNOSIS — D563 Thalassemia minor: Secondary | ICD-10-CM | POA: Diagnosis present

## 2020-09-30 DIAGNOSIS — O26893 Other specified pregnancy related conditions, third trimester: Secondary | ICD-10-CM | POA: Diagnosis not present

## 2020-09-30 DIAGNOSIS — Z3A38 38 weeks gestation of pregnancy: Secondary | ICD-10-CM

## 2020-09-30 DIAGNOSIS — Z20822 Contact with and (suspected) exposure to covid-19: Secondary | ICD-10-CM | POA: Diagnosis not present

## 2020-09-30 DIAGNOSIS — O34219 Maternal care for unspecified type scar from previous cesarean delivery: Principal | ICD-10-CM | POA: Diagnosis present

## 2020-09-30 LAB — CBC
HCT: 41.7 % (ref 36.0–46.0)
Hemoglobin: 14 g/dL (ref 12.0–15.0)
MCH: 26.3 pg (ref 26.0–34.0)
MCHC: 33.6 g/dL (ref 30.0–36.0)
MCV: 78.4 fL — ABNORMAL LOW (ref 80.0–100.0)
Platelets: 153 10*3/uL (ref 150–400)
RBC: 5.32 MIL/uL — ABNORMAL HIGH (ref 3.87–5.11)
RDW: 14.5 % (ref 11.5–15.5)
WBC: 13.7 10*3/uL — ABNORMAL HIGH (ref 4.0–10.5)
nRBC: 0 % (ref 0.0–0.2)

## 2020-09-30 LAB — TYPE AND SCREEN
ABO/RH(D): B POS
Antibody Screen: NEGATIVE

## 2020-09-30 LAB — RESP PANEL BY RT-PCR (FLU A&B, COVID) ARPGX2
Influenza A by PCR: NEGATIVE
Influenza B by PCR: NEGATIVE
SARS Coronavirus 2 by RT PCR: NEGATIVE

## 2020-09-30 MED ORDER — ONDANSETRON HCL 4 MG PO TABS: 4.0000 mg | ORAL_TABLET | ORAL | Status: DC | PRN

## 2020-09-30 MED ORDER — OXYTOCIN 10 UNIT/ML IJ SOLN
10.0000 [IU] | Freq: Once | INTRAMUSCULAR | Status: DC | PRN
Start: 2020-09-30 — End: 2020-09-30

## 2020-09-30 MED ORDER — SOD CITRATE-CITRIC ACID 500-334 MG/5ML PO SOLN: 30.0000 mL | ORAL | Status: DC | PRN

## 2020-09-30 MED ORDER — COCONUT OIL OIL: 1.0000 "application " | TOPICAL_OIL | Status: DC | PRN

## 2020-09-30 MED ORDER — LIDOCAINE HCL (PF) 1 % IJ SOLN
INTRAMUSCULAR | Status: DC | PRN
  Administered 2020-09-30: 10 mL via EPIDURAL

## 2020-09-30 MED ORDER — OXYCODONE-ACETAMINOPHEN 5-325 MG PO TABS: 1.0000 | ORAL_TABLET | ORAL | Status: DC | PRN

## 2020-09-30 MED ORDER — OXYTOCIN-SODIUM CHLORIDE 30-0.9 UT/500ML-% IV SOLN: 2.5000 [IU]/h | INTRAVENOUS | Status: DC

## 2020-09-30 MED ORDER — LACTATED RINGERS IV SOLN: INTRAVENOUS | Status: DC

## 2020-09-30 MED ORDER — DIBUCAINE (PERIANAL) 1 % EX OINT: 1.0000 "application " | TOPICAL_OINTMENT | CUTANEOUS | Status: DC | PRN

## 2020-09-30 MED ORDER — ACETAMINOPHEN 325 MG PO TABS: 650.0000 mg | ORAL_TABLET | ORAL | Status: DC | PRN

## 2020-09-30 MED ORDER — TERBUTALINE SULFATE 1 MG/ML IJ SOLN: 0.2500 mg | Freq: Once | INTRAMUSCULAR | Status: DC | PRN

## 2020-09-30 MED ORDER — ONDANSETRON HCL 4 MG/2ML IJ SOLN: 4.0000 mg | INTRAMUSCULAR | Status: DC | PRN

## 2020-09-30 MED ORDER — IBUPROFEN 600 MG PO TABS
600.0000 mg | ORAL_TABLET | Freq: Four times a day (QID) | ORAL | Status: DC
  Administered 2020-09-30 – 2020-10-01 (×4): 600 mg via ORAL
  Filled 2020-09-30 (×4): qty 1

## 2020-09-30 MED ORDER — BENZOCAINE-MENTHOL 20-0.5 % EX AERO: 1.0000 "application " | INHALATION_SPRAY | CUTANEOUS | Status: DC | PRN

## 2020-09-30 MED ORDER — OXYTOCIN-SODIUM CHLORIDE 30-0.9 UT/500ML-% IV SOLN
1.0000 m[IU]/min | INTRAVENOUS | Status: DC
  Administered 2020-09-30: 12:00:00 2 m[IU]/min via INTRAVENOUS
  Filled 2020-09-30: qty 500

## 2020-09-30 MED ORDER — EPHEDRINE 5 MG/ML INJ: 10.0000 mg | INTRAVENOUS | Status: DC | PRN

## 2020-09-30 MED ORDER — DIPHENHYDRAMINE HCL 50 MG/ML IJ SOLN: 12.5000 mg | INTRAMUSCULAR | Status: DC | PRN

## 2020-09-30 MED ORDER — OXYTOCIN BOLUS FROM INFUSION
333.0000 mL | Freq: Once | INTRAVENOUS | Status: AC
  Administered 2020-09-30: 13:00:00 333 mL via INTRAVENOUS

## 2020-09-30 MED ORDER — PHENYLEPHRINE 40 MCG/ML (10ML) SYRINGE FOR IV PUSH (FOR BLOOD PRESSURE SUPPORT)
80.0000 ug | PREFILLED_SYRINGE | INTRAVENOUS | Status: DC | PRN
  Filled 2020-09-30: qty 10

## 2020-09-30 MED ORDER — ONDANSETRON HCL 4 MG/2ML IJ SOLN: 4.0000 mg | Freq: Four times a day (QID) | INTRAMUSCULAR | Status: DC | PRN

## 2020-09-30 MED ORDER — OXYCODONE-ACETAMINOPHEN 5-325 MG PO TABS: 2.0000 | ORAL_TABLET | ORAL | Status: DC | PRN

## 2020-09-30 MED ORDER — ZOLPIDEM TARTRATE 5 MG PO TABS: 5.0000 mg | ORAL_TABLET | Freq: Every evening | ORAL | Status: DC | PRN

## 2020-09-30 MED ORDER — SIMETHICONE 80 MG PO CHEW: 80.0000 mg | CHEWABLE_TABLET | ORAL | Status: DC | PRN

## 2020-09-30 MED ORDER — LIDOCAINE HCL (PF) 1 % IJ SOLN
30.0000 mL | INTRAMUSCULAR | Status: AC | PRN
Start: 2020-09-30 — End: 2020-09-30
  Administered 2020-09-30: 30 mL via SUBCUTANEOUS
  Filled 2020-09-30: qty 30

## 2020-09-30 MED ORDER — SENNOSIDES-DOCUSATE SODIUM 8.6-50 MG PO TABS
2.0000 | ORAL_TABLET | Freq: Every day | ORAL | Status: DC
  Filled 2020-09-30: qty 2

## 2020-09-30 MED ORDER — WITCH HAZEL-GLYCERIN EX PADS: 1.0000 "application " | MEDICATED_PAD | CUTANEOUS | Status: DC | PRN

## 2020-09-30 MED ORDER — DIPHENHYDRAMINE HCL 25 MG PO CAPS: 25.0000 mg | ORAL_CAPSULE | Freq: Four times a day (QID) | ORAL | Status: DC | PRN

## 2020-09-30 MED ORDER — LACTATED RINGERS IV SOLN: 500.0000 mL | Freq: Once | INTRAVENOUS | Status: DC

## 2020-09-30 MED ORDER — PHENYLEPHRINE 40 MCG/ML (10ML) SYRINGE FOR IV PUSH (FOR BLOOD PRESSURE SUPPORT)
80.0000 ug | PREFILLED_SYRINGE | INTRAVENOUS | Status: AC | PRN
  Administered 2020-09-30 (×3): 80 ug via INTRAVENOUS

## 2020-09-30 MED ORDER — PRENATAL MULTIVITAMIN CH
1.0000 | ORAL_TABLET | Freq: Every day | ORAL | Status: DC
Start: 2020-10-01 — End: 2020-10-01
  Administered 2020-10-01: 12:00:00 1 via ORAL
  Filled 2020-09-30: qty 1

## 2020-09-30 MED ORDER — SODIUM CHLORIDE (PF) 0.9 % IJ SOLN
INTRAMUSCULAR | Status: DC | PRN
  Administered 2020-09-30: 12 mL/h via EPIDURAL

## 2020-09-30 MED ORDER — FENTANYL-BUPIVACAINE-NACL 0.5-0.125-0.9 MG/250ML-% EP SOLN
12.0000 mL/h | EPIDURAL | Status: DC | PRN
  Filled 2020-09-30: qty 250

## 2020-09-30 NOTE — Anesthesia Procedure Notes (Signed)
Epidural Patient location during procedure: OB Start time: 09/30/2020 10:49 AM End time: 09/30/2020 10:59 AM  Staffing Anesthesiologist: Mellody Dance, MD Performed: anesthesiologist   Preanesthetic Checklist Completed: patient identified, IV checked, site marked, risks and benefits discussed, monitors and equipment checked, pre-op evaluation and timeout performed  Epidural Patient position: sitting Prep: DuraPrep Patient monitoring: heart rate, cardiac monitor, continuous pulse ox and blood pressure Approach: midline Location: L2-L3 Injection technique: LOR saline  Needle:  Needle type: Tuohy  Needle gauge: 17 G Needle length: 9 cm Needle insertion depth: 6 cm Catheter type: closed end flexible Catheter size: 20 Guage Catheter at skin depth: 11 cm Test dose: negative and Other  Assessment Events: blood not aspirated, injection not painful, no injection resistance and negative IV test  Additional Notes Informed consent obtained prior to proceeding including risk of failure, 1% risk of PDPH, risk of minor discomfort and bruising.  Discussed rare but serious complications including epidural abscess, permanent nerve injury, epidural hematoma.  Discussed alternatives to epidural analgesia and patient desires to proceed.  Timeout performed pre-procedure verifying patient name, procedure, and platelet count.  Patient tolerated procedure well.

## 2020-09-30 NOTE — H&P (Signed)
OBSTETRIC ADMISSION HISTORY AND PHYSICAL  Melisha Forrey is a 29 y.o. female G54P2002 with IUP at 92w2dby LMP presenting for SOL.   Reports fetal movement. Denies vaginal bleeding.  [facility]  Support person in labor: spouse  Ultrasounds Anatomy U/S: Normal interval growth with measurements consistent with  dates  There is good amniotic fluid volume and fetal movement.  Low risk NIPS observed  Prenatal History/Complications: . H/O C/S . H/O successful VBAC . Silent Alpha Thalassemia . 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 Latent TB  OB BOX: Nursing Staff Provider  Office Location  [facility]  Dating  Anatomy [place] Language  Vietnamese Anatomy [place] Normal, EDC changed to 10/12/20  Flu Vaccine  07/19/2020 Genetic Screen  NIPS: low-risk female  TDaP vaccine  07/19/2020 Hgb A1C or  GTT Early  Third trimester  Glucose, Fasting 65 - 91 mg/dL 81   Glucose, 1 hour 65 - 179 mg/dL 106   Glucose, 2 hour 65 - 152 mg/dL 73     Rhogam  n/a   LAB RESULTS     Blood Type B/Positive/-- (07/06 1102)   Feeding Plan Breast  Antibody Negative (07/06 1102)  Contraception Undecided 10/26 Rubella >33.00 (07/06 1102)  Circumcision Yes  RPR Non Reactive (10/12 0925)   Pediatrician  [facility]  HBsAg Negative (07/06 1102)   Support Person FOB "Vinh" HCVAb Negative  Prenatal Classes No  HIV Non Reactive (10/12 0925)     BTL Consent  GBS Negative/-- (12/14 0907)(For PCN allergy, check sensitivities)   VBAC Consent  Pap 04/12/2020    Hgb Electro  Silent alpha-thal, carrier HGB E  BP Cuff Ordered 04/05/20 CF Neg Horizon    SMA Neg Horizon    Waterbirth  [ ]  Class [ ]  Consent [ ]  CNM visit    Induction  [ ]  Orders Entered [ ] Foley Y/N   Past Medical History: Past Medical History:  Diagnosis Date  . Tuberculosis 2016    Past Surgical History: Past Surgical History:  Procedure Laterality Date  . CESAREAN SECTION      Obstetrical History: OB History    Gravida  3   Para  2   Term   2   Preterm      AB      Living  2     SAB      IAB      Ectopic      Multiple      Live Births  2           Social History: Social History   Socioeconomic History  . Marital status: Married    Spouse name: Not on file  . Number of children: Not on file  . Years of education: Not on file  . Highest education level: Not on file  Occupational History  . Not on file  Tobacco Use  . Smoking status: Never Smoker  . Smokeless tobacco: Never Used  Vaping Use  . Vaping Use: Never used  Substance and Sexual Activity  . Alcohol use: Never  . Drug use: Never  . Sexual activity: Yes    Birth control/protection: None  Other Topics Concern  . Not on file  Social History Narrative  . Not on file   Social Determinants of Health   Financial Resource Strain: Not on file  Food Insecurity: Not on file  Transportation Needs: Not on file  Physical Activity: Not on file  Stress: Not on  file  Social Connections: Not on file    Family History: History reviewed. No pertinent family history.  Allergies: No Known Allergies  Medications Prior to Admission  Medication Sig Dispense Refill Last Dose  . Blood Pressure Monitoring (BLOOD PRESSURE KIT) DEVI 1 Device by Does not apply route as needed. 1 each 0   . Prenatal w/o A Vit-Fe Fum-FA (PRENATAL-U) 106.5-1 MG CAPS Take 1 capsule by mouth daily. 30 capsule 11      Review of Systems  All systems reviewed and negative except as stated in HPI  Blood pressure 118/66, pulse 85, temperature (!) 97.5 F (36.4 C), temperature source Oral, resp. rate 18, height 5' 0.63" (1.54 m), weight 78.9 kg, last menstrual period 12/22/2019, SpO2 97 %. General appearance: alert, cooperative and mild distress Lungs: no respiratory distress Heart: regular rate  Abdomen: soft, non-tender; gravid  Pelvic: adequate Extremities: Homans sign is negative, no sign of DVT Presentation: cephalic Fetal monitoring: 140 bpm / moderate variability  / accels present / decels absent  Uterine activity: regular every 6-8 mins  Dilation: 10 Effacement (%): 90 Station: Plus 1 Exam by:: k fields, rn  Prenatal labs: ABO, Rh: --/--/B POS (12/24 1010) Antibody: NEG (12/24 1010) Rubella: >33.00 (07/06 1102) RPR: Non Reactive (10/12 0925)  HBsAg: Negative (07/06 1102)  HIV: Non Reactive (10/12 0925)  GBS: Negative/-- (12/14 0907)  Glucola: Normal Genetic screening:  Normal  Prenatal Transfer Tool  Maternal Diabetes: No Genetic Screening: Normal Maternal Ultrasounds/Referrals: Normal Fetal Ultrasounds or other Referrals:  None Maternal Substance Abuse:  No Significant Maternal Medications:  None Significant Maternal Lab Results: Group B Strep negative  Results for orders placed or performed during the hospital encounter of 09/30/20 (from the past 24 hour(s))  Resp Panel by RT-PCR (Flu A&B, Covid) Nasopharyngeal Swab   Collection Time: 09/30/20  9:43 AM   Specimen: Nasopharyngeal Swab; Nasopharyngeal(NP) swabs in vial transport medium  Result Value Ref Range   SARS Coronavirus 2 by RT PCR NEGATIVE NEGATIVE   Influenza A by PCR NEGATIVE NEGATIVE   Influenza B by PCR NEGATIVE NEGATIVE  Type and screen [place]   Collection Time: 09/30/20 10:10 AM  Result Value Ref Range   ABO/RH(D) B POS    Antibody Screen NEG    Sample Expiration      10/03/2020,2359 Performed at [facility], [place] [address], [place], [place] [address]   CBC   Collection Time: 09/30/20 10:13 AM  Result Value Ref Range   WBC 13.7 (H) 4.0 - 10.5 K/uL   RBC 5.32 (H) 3.87 - 5.11 MIL/uL   Hemoglobin 14.0 12.0 - 15.0 g/dL   HCT 41.7 36.0 - 46.0 %   MCV 78.4 (L) 80.0 - 100.0 fL   MCH 26.3 26.0 - 34.0 pg   MCHC 33.6 30.0 - 36.0 g/dL   RDW 14.5 11.5 - 15.5 %   Platelets 153 150 - 400 K/uL   nRBC 0.0 0.0 - 0.2 %    Patient Active Problem List   Diagnosis Date Noted  . Indication for care in labor or delivery 09/30/2020  .  Hemoglobin E trait ([place]) 08/02/2020  . Encounter for supervision of normal pregnancy, antepartum 04/05/2020  . VBAC (vaginal birth after Cesarean) 07/15/2018  . Language barrier affecting health care 02/05/2018  . History of tuberculosis 02/05/2018  . Previous cesarean section 02/05/2018  . Alpha thalassemia silent carrier 01/29/2018    Assessment/Plan:  Anella Felling is a 29 y.o. G3P2002 at  39w2dhere for SOL  Labor: active -- pain control: epidural  Fetal Wellbeing: EFW 6 lbs by Leopold[employer]. Cephalic by SVE.  -- GBS (Neg) -- continuous fetal monitoring   Postpartum Planning -- breast -- [x] Tdap   RLaury Deep CNM  09/30/2020, 1:50 PM

## 2020-09-30 NOTE — Discharge Summary (Signed)
Postpartum Discharge Summary     Patient Name: Jillian Browning DOB: 30-Jul-1991 MRN: [id number]  Date of admission: 09/30/2020 Delivery date:09/30/2020  Delivering provider: Laury Deep  Date of discharge: 10/01/2020  Admitting diagnosis: Indication for care in labor or delivery [O75.9] Intrauterine pregnancy: [redacted]w[redacted]d    Secondary diagnosis:  Active Problems:   Indication for care in labor or delivery  Additional problems: none    Discharge diagnosis: Term Pregnancy Delivered                                              Post partum procedures: none Augmentation: AROM and Pitocin Complications: None  Hospital course: Onset of Labor With Vaginal Delivery      29y.o. yo G3P3003 at 331w2das admitted in Active Labor on 09/30/2020. Patient had an uncomplicated labor course as follows:  Membrane Rupture Time/Date: 12:36 PM ,09/30/2020   Delivery Method:VBAC, Spontaneous  Episiotomy: None  Lacerations:  2nd degree;Vaginal  Patient had an uncomplicated postpartum course.  She is ambulating, tolerating a regular diet, passing flatus, and urinating well. Patient is discharged home in stable condition on 10/01/20.  Newborn Data: Birth date:09/30/2020  Birth time:12:44 PM  Gender:Female  Living status:Living  Apgars:8 ,8  [phone]   Magnesium Sulfate received: No BMZ received: No Rhophylac:N/A MMR:Yes T-DaP:Given prenatally Flu: Yes Transfusion:No  Physical exam  Vitals:   09/30/20 1538 09/30/20 2000 10/01/20 0000 10/01/20 0400  BP: 114/88 111/73 (!) 94/55 118/78  Pulse: 68 60 72 66  Resp: 16 17 17 16   Temp: 98.1 F (36.7 C) 97.7 F (36.5 C) 98.4 F (36.9 C) 98.6 F (37 C)  TempSrc:      SpO2: 99% 100% 99% 100%  Weight:      Height:       General: alert, cooperative and no distress Lochia: appropriate Uterine Fundus: firm Incision: N/A DVT Evaluation: No evidence of DVT seen on physical exam. No cords or calf tenderness. No significant calf/ankle  edema. Labs: Lab Results  Component Value Date   WBC 13.7 (H) 09/30/2020   HGB 14.0 09/30/2020   HCT 41.7 09/30/2020   MCV 78.4 (L) 09/30/2020   PLT 153 09/30/2020   No flowsheet data found. Edinburgh Score: Edinburgh Postnatal Depression Scale Screening Tool 10/01/2020  I have been able to laugh and see the funny side of things. 0  I have looked forward with enjoyment to things. 0  I have blamed myself unnecessarily when things went wrong. 0  I have been anxious or worried for no good reason. 0  I have felt scared or panicky for no good reason. 0  Things have been getting on top of me. 0  I have been so unhappy that I have had difficulty sleeping. 0  I have felt sad or miserable. 0  I have been so unhappy that I have been crying. 0  The thought of harming myself has occurred to me. 0  Edinburgh Postnatal Depression Scale Total 0     After visit meds:  Allergies as of 10/01/2020   No Known Allergies     Medication List    STOP taking these medications   Blood Pressure Kit Devi     TAKE these medications   acetaminophen 325 MG tablet Commonly known as: Tylenol Take 2 tablets (650 mg total) by mouth every 6 (six) hours as  needed for mild pain, moderate pain, fever or headache (for pain scale < 4).   coconut oil Oil Apply 1 application topically as needed (nipple pain).   ibuprofen 600 MG tablet Commonly known as: ADVIL Take 1 tablet (600 mg total) by mouth every 8 (eight) hours as needed for moderate pain or cramping.   Prenatal-U 106.5-1 MG Caps Take 1 capsule by mouth daily.        Discharge home in stable condition Infant Feeding: Breast Infant Disposition:home with mother Discharge instruction: per After Visit Summary and Postpartum booklet. Activity: Advance as tolerated. Pelvic rest for 6 weeks.  Diet: routine diet Future Appointments: Future Appointments  Date Time Provider [place]  10/05/2020  8:30 AM Megan Salon, MD [place] None   10/12/2020  8:35 AM Laury Deep, CNM [place] None   Follow up Visit:  [place] Follow up in 6 week(s).   Contact information: [address] [place] [id number] [phone]             Please schedule this patient for a In person postpartum visit in 4 weeks with the following provider: Any provider. Additional Postpartum F/U:none  Low risk pregnancy complicated by: TOLAC Delivery mode:  VBAC, Spontaneous  Anticipated Birth Control:  Unsure   10/01/2020 Randa Ngo, MD

## 2020-09-30 NOTE — MAU Note (Signed)
Pains started last night at 2000, coming every 10 min. Denies bleeding or leaking. Was one cm last week. Denies problems with preg.  +FM

## 2020-09-30 NOTE — Anesthesia Preprocedure Evaluation (Signed)
Anesthesia Evaluation  Patient identified by MRN, date of birth, ID band Patient awake    Reviewed: Allergy & Precautions, NPO status , Patient's Chart, lab work & pertinent test results  Airway Mallampati: II  TM Distance: >3 FB Neck ROM: Full    Dental no notable dental hx.    Pulmonary neg pulmonary ROS,    Pulmonary exam normal breath sounds clear to auscultation       Cardiovascular negative cardio ROS Normal cardiovascular exam Rhythm:Regular Rate:Normal     Neuro/Psych negative neurological ROS  negative psych ROS   GI/Hepatic negative GI ROS, Neg liver ROS,   Endo/Other  negative endocrine ROS  Renal/GU negative Renal ROS  negative genitourinary   Musculoskeletal negative musculoskeletal ROS (+)   Abdominal   Peds negative pediatric ROS (+)  Hematology negative hematology ROS (+)   Anesthesia Other Findings   Reproductive/Obstetrics (+) Pregnancy                             Anesthesia Physical  Anesthesia Plan  ASA: II  Anesthesia Plan: Epidural   Post-op Pain Management:    Induction:   PONV Risk Score and Plan:   Airway Management Planned:   Additional Equipment:   Intra-op Plan:   Post-operative Plan:   Informed Consent: I have reviewed the patients History and Physical, chart, labs and discussed the procedure including the risks, benefits and alternatives for the proposed anesthesia with the patient or authorized representative who has indicated his/her understanding and acceptance.     Interpreter used for [employer] Discussed with: Anesthesiologist  Anesthesia Plan Comments:         Anesthesia Quick Evaluation

## 2020-09-30 NOTE — MAU Note (Signed)
Pt is a G3P2 at 38.2 c/o contractions this am.  Pt states she desires VBAC with this pregnancy, has had previous VBAC with G2. Pt denies any complications with this pregnancy.

## 2020-10-01 DIAGNOSIS — Z8759 Personal history of other complications of pregnancy, childbirth and the puerperium: Secondary | ICD-10-CM

## 2020-10-01 LAB — RPR
RPR Ser Ql: REACTIVE — AB
RPR Titer: 1:1 {titer}

## 2020-10-01 MED ORDER — COCONUT OIL OIL: 1.0000 "application " | TOPICAL_OIL | 0 refills | Status: DC | PRN

## 2020-10-01 MED ORDER — ACETAMINOPHEN 325 MG PO TABS: 650.0000 mg | ORAL_TABLET | Freq: Four times a day (QID) | ORAL | Status: DC | PRN

## 2020-10-01 MED ORDER — IBUPROFEN 600 MG PO TABS: 600.0000 mg | ORAL_TABLET | Freq: Three times a day (TID) | ORAL | 0 refills | Status: DC | PRN

## 2020-10-01 NOTE — Anesthesia Postprocedure Evaluation (Signed)
Anesthesia Post Note  Patient: Jillian Browning  Procedure(s) Performed: AN AD HOC LABOR EPIDURAL     Patient location during evaluation: Mother Baby Anesthesia Type: Epidural Level of consciousness: awake and alert and oriented Pain management: satisfactory to patient Vital Signs Assessment: post-procedure vital signs reviewed and stable Respiratory status: spontaneous breathing and nonlabored ventilation Cardiovascular status: stable Postop Assessment: no headache, no backache, no signs of nausea or vomiting, adequate PO intake, patient able to bend at knees and able to ambulate (patient up walking) Anesthetic complications: no   No complications documented.  Last Vitals:  Vitals:   10/01/20 0000 10/01/20 0400  BP: (!) 94/55 118/78  Pulse: 72 66  Resp: 17 16  Temp: 36.9 C 37 C  SpO2: 99% 100%    Last Pain:  Vitals:   10/01/20 0800  TempSrc:   PainSc: 0-No pain   Pain Goal:                   [name]

## 2020-10-03 LAB — T.PALLIDUM AB, TOTAL: T Pallidum Abs: NONREACTIVE

## 2020-10-05 ENCOUNTER — Encounter: Payer: Medicaid Other | Admitting: Obstetrics & Gynecology

## 2020-10-10 ENCOUNTER — Encounter: Payer: Medicaid Other | Admitting: Advanced Practice Midwife

## 2020-10-12 ENCOUNTER — Encounter: Payer: Medicaid Other | Admitting: Obstetrics and Gynecology

## 2020-10-31 ENCOUNTER — Ambulatory Visit: Payer: Medicaid Other | Admitting: Advanced Practice Midwife

## 2020-11-01 ENCOUNTER — Encounter: Payer: Self-pay | Admitting: Advanced Practice Midwife

## 2020-11-01 ENCOUNTER — Other Ambulatory Visit: Payer: Self-pay

## 2020-11-01 ENCOUNTER — Ambulatory Visit (INDEPENDENT_AMBULATORY_CARE_PROVIDER_SITE_OTHER): Payer: Medicaid Other | Admitting: Advanced Practice Midwife

## 2020-11-01 DIAGNOSIS — Z789 Other specified health status: Secondary | ICD-10-CM

## 2020-11-01 DIAGNOSIS — O34219 Maternal care for unspecified type scar from previous cesarean delivery: Secondary | ICD-10-CM

## 2020-11-01 DIAGNOSIS — K5900 Constipation, unspecified: Secondary | ICD-10-CM

## 2020-11-01 NOTE — Progress Notes (Signed)
Post Partum Visit Note  Jillian Browning is a 30 y.o. G5P3003 female who presents for a postpartum visit. She is 4 weeks postpartum following a normal spontaneous vaginal delivery.  I have fully reviewed the prenatal and intrapartum course. The delivery was at 38 gestational weeks.  Anesthesia: epidural. Postpartum course has been unremarkable  Baby is doing well. Baby is feeding by bottle Rush Barer . Bleeding no bleeding. Bowel function is normal. Bladder function is normal. Patient is not sexually active. Contraception method is none. Postpartum depression screening: negative.   The pregnancy intention screening data noted above was reviewed. Potential methods of contraception were discussed. The patient elected to proceed with No Method - Other Reason.    Edinburgh Postnatal Depression Scale - 11/01/20 0915      Edinburgh Postnatal Depression Scale:  In the Past 7 Days   I have been able to laugh and see the funny side of things. 0    I have looked forward with enjoyment to things. 0    I have blamed myself unnecessarily when things went wrong. 0    I have been anxious or worried for no good reason. 0    I have felt scared or panicky for no good reason. 0    Things have been getting on top of me. 0    I have been so unhappy that I have had difficulty sleeping. 0    I have felt sad or miserable. 0    I have been so unhappy that I have been crying. 0    The thought of harming myself has occurred to me. 0    Edinburgh Postnatal Depression Scale Total 0            The following portions of the patient's history were reviewed and updated as appropriate: allergies, current medications, past family history, past medical history, past social history, past surgical history and problem list.  Review of Systems Pertinent items noted in HPI and remainder of comprehensive ROS otherwise negative.    Objective:  BP 100/68   Pulse 64   Wt 155 lb (70.3 kg)   LMP 12/22/2019   Breastfeeding No   BMI  29.65 kg/m    VS reviewed, nursing note reviewed,  Constitutional: well developed, well nourished, no distress HEENT: normocephalic CV: normal rate Pulm/chest wall: normal effort Abdomen: soft Neuro: alert and oriented x 3 Skin: warm, dry Psych: affect normal   Assessment:   1. Postpartum care following vaginal delivery --Doing well, bonding well with baby, good support at home --Declines pregnancy prevention/contraception.  Desires more pregnancies.  2. VBAC, delivered --Hx C/S x 1 followed by VBAC x 2, desires more children  3. Language barrier affecting health care --[place] interpreter used for all communication  4. Constipation, unspecified constipation type --Discussed dietary changes including increased PO fluids and fiber. Pt wants to try these first but reviewed OTC options including Colace and Miralax to try PRN.  Plan:   Essential components of care per ACOG recommendations:  1.  Mood and well being: Patient with negative depression screening today. Reviewed local resources for support.  - Patient does not use tobacco.  - hx of drug use? No    2. Infant care and feeding:  -Patient currently breastmilk feeding? No  -Social determinants of health (SDOH) reviewed in EPIC. No concerns  3. Sexuality, contraception and birth spacing - Patient does want a pregnancy in the next year.  Desired family size is unsure .  -  Reviewed forms of contraception in tiered fashion. Patient desired no method today.   - Discussed birth spacing of 18 months  4. Sleep and fatigue -Encouraged family/partner/community support of 4 hrs of uninterrupted sleep to help with mood and fatigue  5. Physical Recovery  - Discussed patients delivery without complications - Patient had a second degree laceration, perineal healing reviewed. Patient expressed understanding - Patient has urinary incontinence? No - Patient is safe to resume physical and sexual activity  6.  Health  Maintenance - Last pap smear done 04/12/2020 and was normal with negative HPV.   [name], CNM Center for [employer], [facility]

## 2020-11-01 NOTE — Patient Instructions (Signed)
For constipation:  Colace (docusate sodium) 100 mg one time or two times per day Miralax (Polyethylene glycol)  One time per day   Constipation, Adult Constipation is when a person has fewer than three bowel movements in a week, has difficulty having a bowel movement, or has stools (feces) that are dry, hard, or larger than normal. Constipation may be caused by an underlying condition. It may become worse with age if a person takes certain medicines and does not take in enough fluids. Follow these instructions at home: Eating and drinking  Eat foods that have a lot of fiber, such as beans, whole grains, and fresh fruits and vegetables.  Limit foods that are low in fiber and high in fat and processed sugars, such as fried or sweet foods. These include french fries, hamburgers, cookies, candies, and soda.  Drink enough fluid to keep your urine pale yellow.   General instructions  Exercise regularly or as told by your health care provider. Try to do 150 minutes of moderate exercise each week.  Use the bathroom when you have the urge to go. Do not hold it in.  Take over-the-counter and prescription medicines only as told by your health care provider. This includes any fiber supplements.  During bowel movements: ? Practice deep breathing while relaxing the lower abdomen. ? Practice pelvic floor relaxation.  Watch your condition for any changes. Let your health care provider know about them.  Keep all follow-up visits as told by your health care provider. This is important. Contact a health care provider if:  You have pain that gets worse.  You have a fever.  You do not have a bowel movement after 4 days.  You vomit.  You are not hungry or you lose weight.  You are bleeding from the opening between the buttocks (anus).  You have thin, pencil-like stools. Get help right away if:  You have a fever and your symptoms suddenly get worse.  You leak stool or have blood in your  stool.  Your abdomen is bloated.  You have severe pain in your abdomen.  You feel dizzy or you faint. Summary  Constipation is when a person has fewer than three bowel movements in a week, has difficulty having a bowel movement, or has stools (feces) that are dry, hard, or larger than normal.  Eat foods that have a lot of fiber, such as beans, whole grains, and fresh fruits and vegetables.  Drink enough fluid to keep your urine pale yellow.  Take over-the-counter and prescription medicines only as told by your health care provider. This includes any fiber supplements. This information is not intended to replace advice given to you by your health care provider. Make sure you discuss any questions you have with your health care provider. Document Revised: 08/12/2019 Document Reviewed: 08/12/2019 Elsevier Patient Education  2021 [employer].

## 2020-12-01 IMAGING — US US MFM OB DETAIL+14 WK
1 series · 13 of 28 positions shown · non-contrast
Comparison: none

[Series 1: us mfm ob detail+14 wk · 116 acquisitions, 13 frames shown]
[im 5/116]
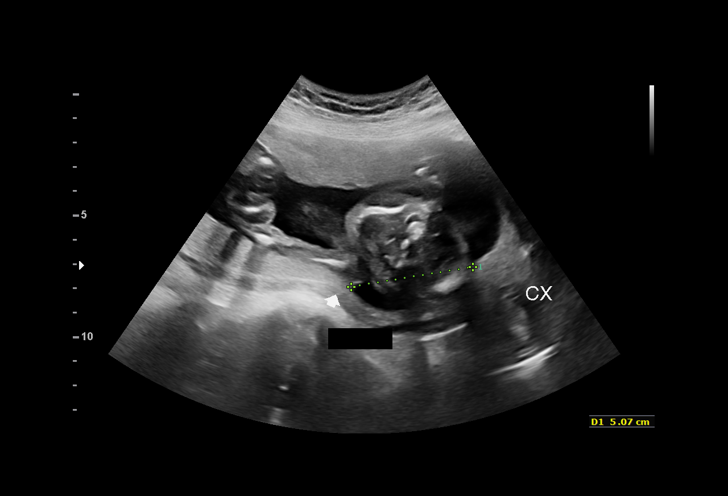
[im 13/116]
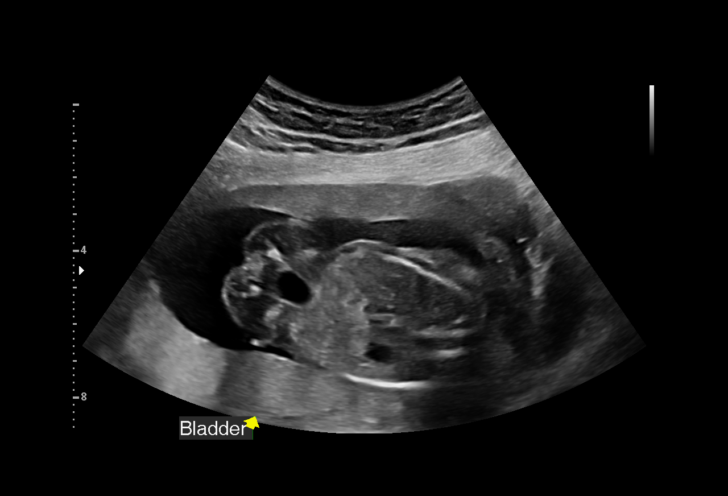
[im 22/116]
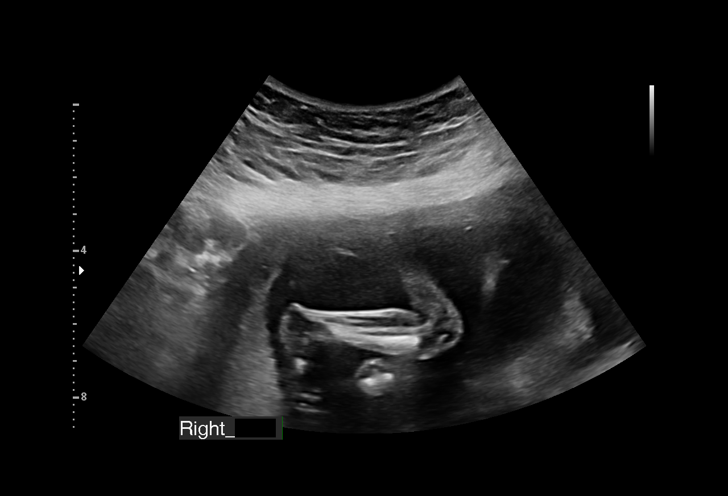
[im 30/116]
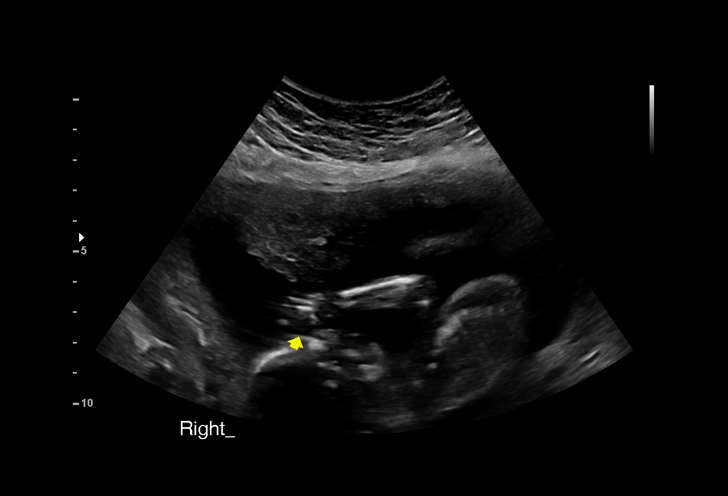
[im 39/116]
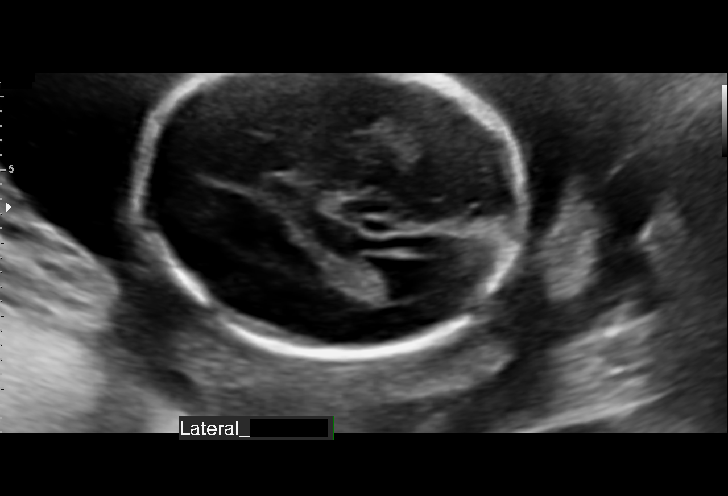
[im 47/116]
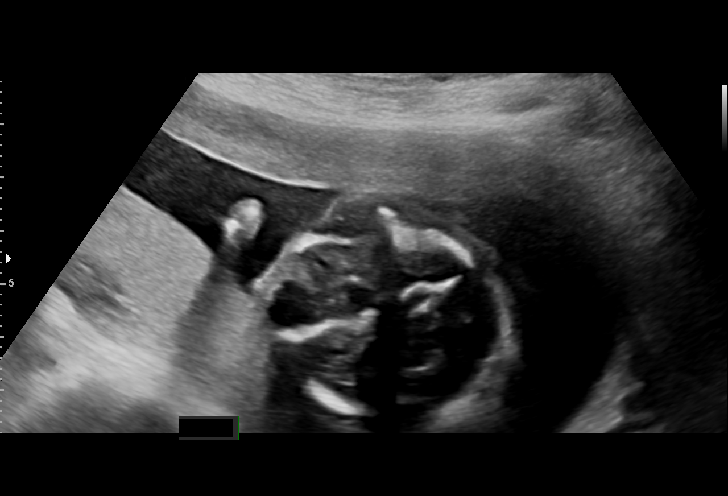
[im 60/116]
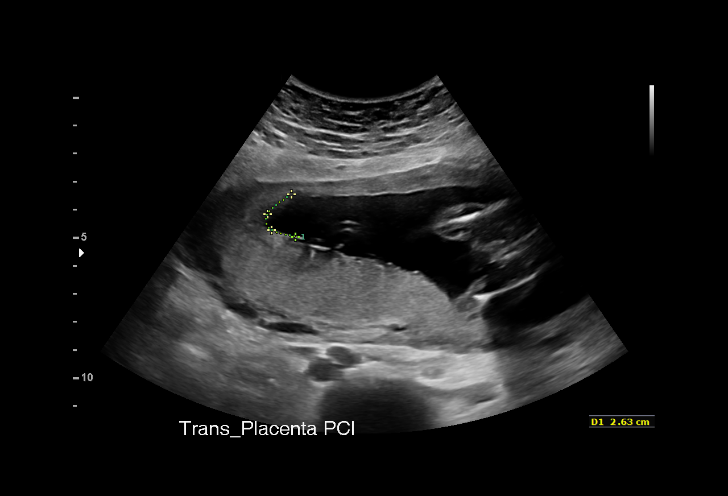
[im 69/116]
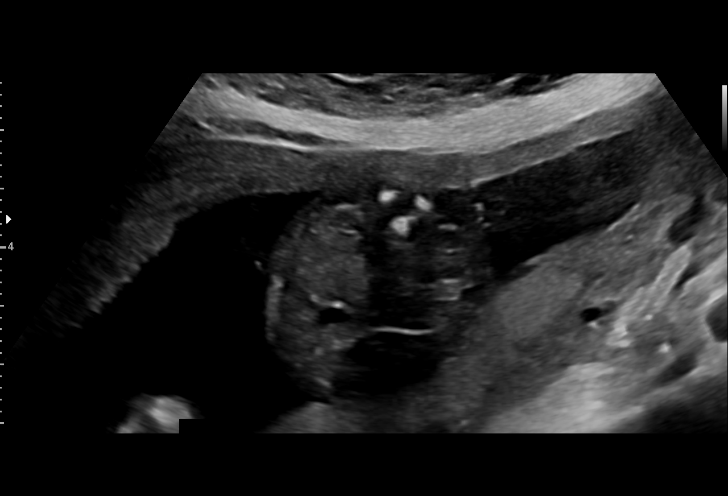
[im 77/116]
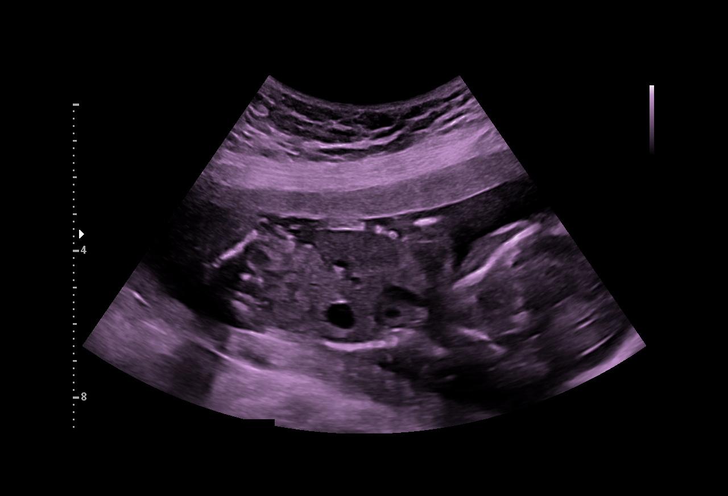
[im 86/116]
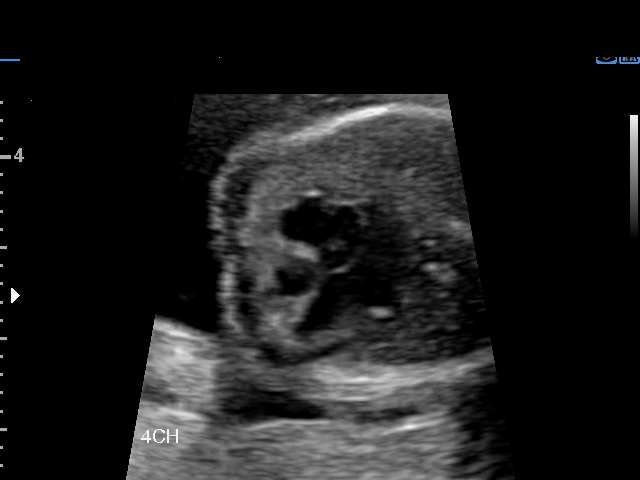
[im 94/116]
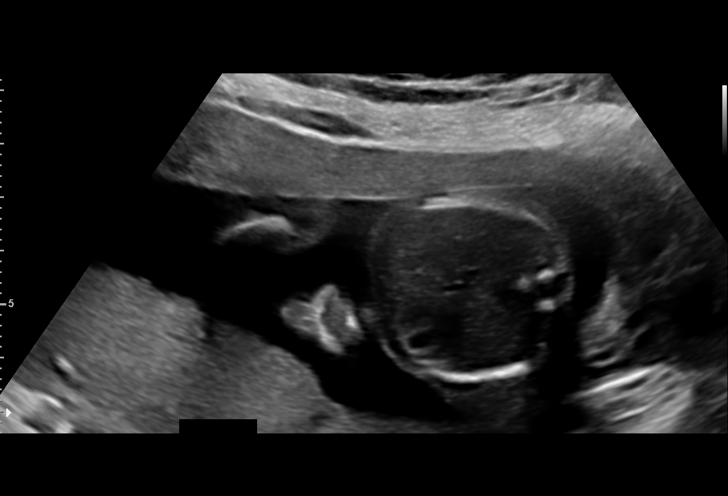
[im 103/116]
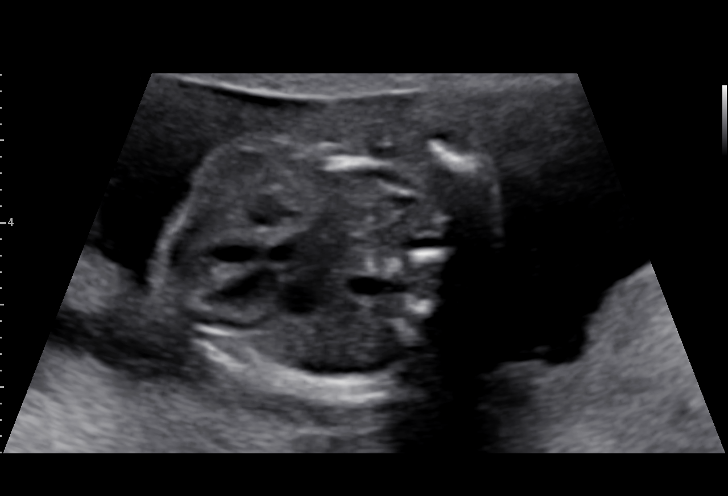
[im 111/116]
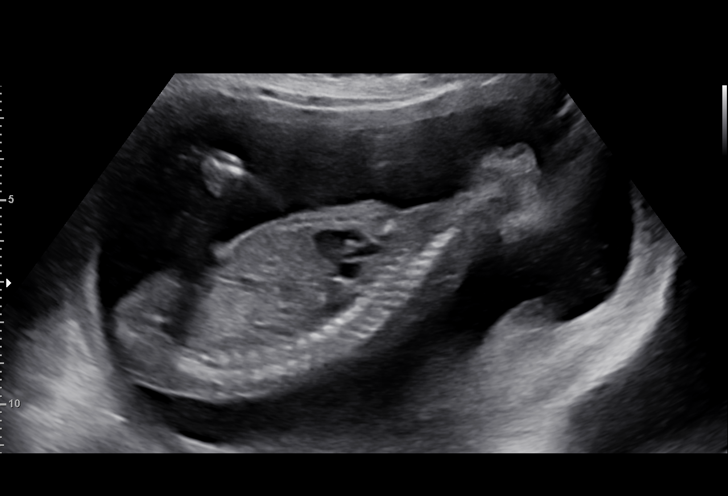

[13 of 28 positions shown; findings below may reference images not displayed]

Indications

 History of cesarean delivery, currently
 pregnant
 Genetic carrier (Tejaun Soy silent carrier,
 carrier for Hemoglobin E)
 18 weeks gestation of pregnancy
 Encounter for antenatal screening for
 malformations
 LR NIPS
Fetal Evaluation

 Num Of Fetuses:         1
 Fetal Heart Rate(bpm):  158
 Cardiac Activity:       Observed
 Presentation:           Cephalic
 Placenta:               Posterior
 P. Cord Insertion:      Marginal insertion

 Amniotic Fluid
 AFI FV:      Within normal limits

                             Largest Pocket(cm)

Biometry
 BPD:        42  mm     G. Age:  18w 5d         70  %    CI:        74.04   %    70 - 86
                                                         FL/HC:      16.4   %    15.8 - 18
 HC:       155   mm     G. Age:  18w 3d         50  %    HC/AC:      1.21        1.07 -
 AC:      128.3  mm     G. Age:  18w 3d         51  %    FL/BPD:     60.5   %
 FL:       25.4  mm     G. Age:  17w 5d         23  %    FL/AC:      19.8   %    20 - 24
 HUM:        27  mm     G. Age:  18w 4d         63  %
 CER:      19.5  mm     G. Age:  19w 0d         82  %
 NFT:       4.2  mm

 LV:        6.1  mm
 CM:        4.1  mm

 Est. FW:     225  gm      0 lb 8 oz     35  %
OB History

 Gravidity:    3         Term:   2
 Living:       2
Gestational Age

 LMP:           20w 3d        Date:  12/22/19                 EDD:   09/27/20
 U/S Today:     18w 2d                                        EDD:   10/12/20
 Best:          18w 2d     Det. By:  U/S (05/13/20)           EDD:   10/12/20
Anatomy

 Cranium:               Appears normal         LVOT:                   Appears normal
 Cavum:                 Appears normal         Aortic Arch:            Not well visualized
 Ventricles:            Appears normal         Ductal Arch:            Not well visualized
 Choroid Plexus:        Appears normal         Diaphragm:              Appears normal
 Cerebellum:            Appears normal         Stomach:                Appears normal, left
                                                                       sided
 Posterior Fossa:       Appears normal         Abdomen:                Appears normal
 Nuchal Fold:           Appears normal         Abdominal Wall:         Appears nml (cord
                                                                       insert, abd wall)
 Face:                  Appears normal         Cord Vessels:           Appears normal (3
                        (orbits and profile)                           vessel cord)
 Lips:                  Appears normal         Kidneys:                Appear normal
 Palate:                Not well visualized    Bladder:                Appears normal
 Thoracic:              Appears normal         Spine:                  Appears normal
 Heart:                 Not well visualized    Upper Extremities:      Appears normal
 RVOT:                  Not well visualized    Lower Extremities:      Appears normal

 Other:  Fetus appears to be a male. Heels and LT Open hands visualized.
         Nasal bone visualized. 3VV visualized. 5th digit visualized.
         Technically difficult due to fetal position.
Cervix Uterus Adnexa

 Cervix
 Length:           3.48  cm.
 Normal appearance by transabdominal scan.
Comments

 This patient was seen for a detailed fetal anatomy scan.  She
 has screened positive as a silent carrier for alpha
 thalassemia and carrier for hemoglobin E disease.
 She denies any significant past medical history and denies
 any problems in her current pregnancy.
 She had a cell free DNA test earlier in her pregnancy which
 indicated a low risk for trisomy 21, 18, and 13. A male fetus is
 predicted.
 Based on the fetal biometry measurements obtained today,
 her EDC should be changed to October 12, 2020.
 There were no obvious fetal anomalies noted on today's
 ultrasound exam.  However, the views of the fetal anatomy
 were limited today due to the fetal position.
 The patient was informed that anomalies may be missed due
 to technical limitations. If the fetus is in a suboptimal position
 or maternal habitus is increased, visualization of the fetus in
 the maternal uterus may be impaired.
 A possible marginal cord insertion was noted today.  The
 small association of a marginal placental cord insertion with
 fetal growth restriction was discussed.  We will continue to
 follow her with serial growth ultrasounds due to this finding.

 A follow-up exam was scheduled in 4 weeks to complete the
 views of the fetal anatomy and to confirm her dates.

 The patient may be referred for a genetic counseling
 appointment as she is a silent carrier for alpha thalassemia
 and the hemoglobin E disease.

## 2023-10-09 NOTE — L&D Delivery Note (Addendum)
   Delivery Note:   [id number] at [redacted]w[redacted]d  Admitting diagnosis: Indication for care in labor or delivery [O75.9] Risks: . 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 Last fetal tracing was at 1130 and FHT 140 moderate variability. Soon after arrival to L&D patient delivered.     CNM & SNM called urgently to bedside. Upon arrival fetal head crowned up to +4 station. Patient Pushing in lithotomy position with CNM and L&D staff support. FOB and 72 year old son present for birth and supportive.   Delivery of a Live born female  Birth Weight:  PENDING  APGAR: 8,9   Newborn Delivery   Birth date/time: 08/13/2024 11:42:00 Delivery type: Vaginal, Spontaneous    With great maternal pushing efforts fetal head delivered in cephalic presentation, position DOA and spontaneously restituted to LOT position. SNM delivered fetal head and anterior shoulder observed. Mom ceased pushing. CNM grasped underneath anterior axilla and maternal pushing efforts resumed. Remaining fetal body delivered with ease and vigorously crying infant placed immediately skin to skin.  Nuchal Cord: No    After 1 mins of life cord double clamped, and cut by FOB.  Collection of cord blood for  typing completed. Cord blood donation- N/A  Arterial cord blood sample-  N/A   Third Stage:  With gentle cord traction Placenta delivered-  Spontaneously intact with  3 vessels . Uterine tone firm bleeding scant Uterotonics: IM pit given d/t lack of IV access  Placenta to L&D for disposal.    2nd laceration identified.  Episiotomy: N/A  Local analgesia: 1% lidocaine    Repair:2nd degree perineal and 1st degree clitoral. Clitoral tear hemostatic and not repaired just numbed with lidocaine . 2nd degree repaired by CNM in traditional fashion with a 3.0 vicryl to hemostasis Est. Blood Loss (mL):75.0  Complications: None   Mom to postpartum.  Baby boy to Couplet care / Skin to Skin.  Delivery Report:  Review the Delivery Report for details.    Yeraldin Litzenberger Erven) Emilio, MSN, CNM  Center for [facility]  08/13/24 12:12 PM

## 2024-01-14 ENCOUNTER — Ambulatory Visit (INDEPENDENT_AMBULATORY_CARE_PROVIDER_SITE_OTHER): Payer: Self-pay | Admitting: *Deleted

## 2024-01-14 VITALS — BP 110/77 | HR 87 | Ht 63.5 in | Wt 141.0 lb

## 2024-01-14 DIAGNOSIS — Z3201 Encounter for pregnancy test, result positive: Secondary | ICD-10-CM | POA: Diagnosis not present

## 2024-01-14 LAB — POCT URINE PREGNANCY: Preg Test, Ur: POSITIVE — AB

## 2024-01-14 MED ORDER — PRENATE PIXIE 10-0.6-0.4-200 MG PO CAPS: 1.0000 | ORAL_CAPSULE | Freq: Every day | ORAL | 11 refills | Status: AC

## 2024-01-14 NOTE — Progress Notes (Signed)
 Jillian Browning presents today for UPT. She has no unusual complaints. LMP:   10/30/23  OBJECTIVE: Appears well, in no apparent distress.  OB History     Gravida  3   Para  3   Term  3   Preterm      AB      Living  3      SAB      IAB      Ectopic      Multiple  0   Live Births  3          Home UPT Result: Positive In-Office UPT result: Positive I have reviewed the patient's medical, obstetrical, social, and family histories, and medications.   ASSESSMENT: Positive pregnancy test  PLAN Prenatal care to be completed at: [facility]/ Intake scheduled for 01/15/24.

## 2024-01-15 ENCOUNTER — Other Ambulatory Visit (INDEPENDENT_AMBULATORY_CARE_PROVIDER_SITE_OTHER): Payer: Self-pay

## 2024-01-15 ENCOUNTER — Ambulatory Visit (INDEPENDENT_AMBULATORY_CARE_PROVIDER_SITE_OTHER): Admitting: *Deleted

## 2024-01-15 VITALS — BP 130/90 | HR 92 | Wt 141.0 lb

## 2024-01-15 DIAGNOSIS — O0991 Supervision of high risk pregnancy, unspecified, first trimester: Secondary | ICD-10-CM | POA: Diagnosis not present

## 2024-01-15 DIAGNOSIS — Z1339 Encounter for screening examination for other mental health and behavioral disorders: Secondary | ICD-10-CM | POA: Diagnosis not present

## 2024-01-15 DIAGNOSIS — O0990 Supervision of high risk pregnancy, unspecified, unspecified trimester: Secondary | ICD-10-CM | POA: Insufficient documentation

## 2024-01-15 DIAGNOSIS — Z3A08 8 weeks gestation of pregnancy: Secondary | ICD-10-CM | POA: Diagnosis not present

## 2024-01-15 DIAGNOSIS — Z3490 Encounter for supervision of normal pregnancy, unspecified, unspecified trimester: Secondary | ICD-10-CM | POA: Insufficient documentation

## 2024-01-15 DIAGNOSIS — O3680X0 Pregnancy with inconclusive fetal viability, not applicable or unspecified: Secondary | ICD-10-CM

## 2024-01-15 NOTE — Patient Instructions (Signed)

## 2024-01-15 NOTE — Progress Notes (Addendum)
 New OB Intake  In Person [place] interpreter used.  I connected with Jillian Browning  on 01/15/24 at  9:15 AM EDT by In Person Visit and verified that I am speaking with the correct person using two identifiers. Nurse is located at [facility] and pt is located at [place].  I discussed the limitations, risks, security and privacy concerns of performing an evaluation and management service by telephone and the availability of in person appointments. I also discussed with the patient that there may be a patient responsible charge related to this service. The patient expressed understanding and agreed to proceed.  I explained I am completing New OB Intake today. We discussed EDD of 08/05/2024, by Last Menstrual Period. Pt is G4P3003. I reviewed her allergies, medications and Medical/Surgical/OB history.    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 Discussed the nature of our practice with multiple providers including residents and students. Due to the size of the practice, the delivering provider may not be the same as those providing prenatal care.   Patient is not interested in water birth. Offered upcoming OB visit with CNM to discuss further.  MyChart/Babyscripts MyChart access verified. I explained pt will have some visits in office and some virtually. Babyscripts instructions given and order placed. Patient verifies receipt of registration text/e-mail. Account successfully created and app downloaded. If patient is a candidate for Optimized scheduling, add to sticky note.   Blood Pressure  Cuff/Weight Scale Blood pressure cuff ordered for patient to pick-up from [employer]. Explained after first prenatal appt pt will check weekly and document in Babyscripts. Patient does not have weight scale; patient may purchase if they desire to track weight weekly in Babyscripts.  Anatomy US Explained first scheduled [place] will be around 19 weeks. Anatomy US scheduled for TBD at TBD.  Interested in [place]? If yes, send referral and doula dot phrase.   Is patient a candidate for Babyscripts Optimization? No, due to Risk Factors and Language Barrier   First visit review I reviewed new OB appt with patient. Explained pt will be seen by [name], CNM at first visit. Discussed [name] genetic screening with patient. Requests Panorama. Routine prenatal labs  OB Urine only collected at today's visit.    Last Pap Diagnosis  Date Value Ref Range Status  04/12/2020      - Negative for Intraepithelial Lesions or Malignancy (NILM)  04/12/2020 - Benign reactive/reparative changes      Harrel Lemon, RN 01/15/2024  9:41 AM

## 2024-01-17 LAB — CULTURE, OB URINE

## 2024-01-17 LAB — URINE CULTURE, OB REFLEX: Organism ID, Bacteria: NO GROWTH

## 2024-01-25 ENCOUNTER — Other Ambulatory Visit: Payer: Self-pay

## 2024-01-25 ENCOUNTER — Encounter (HOSPITAL_COMMUNITY): Payer: Self-pay | Admitting: Obstetrics & Gynecology

## 2024-01-25 ENCOUNTER — Inpatient Hospital Stay (HOSPITAL_COMMUNITY)
Admission: AD | Admit: 2024-01-25 | Discharge: 2024-01-25 | Disposition: A | Discharge status: Home / Self Care | Attending: Obstetrics & Gynecology | Admitting: Obstetrics & Gynecology

## 2024-01-25 DIAGNOSIS — O0991 Supervision of high risk pregnancy, unspecified, first trimester: Secondary | ICD-10-CM | POA: Insufficient documentation

## 2024-01-25 DIAGNOSIS — Z3A10 10 weeks gestation of pregnancy: Secondary | ICD-10-CM | POA: Diagnosis not present

## 2024-01-25 DIAGNOSIS — O4691 Antepartum hemorrhage, unspecified, first trimester: Secondary | ICD-10-CM | POA: Diagnosis present

## 2024-01-25 DIAGNOSIS — O4591 Premature separation of placenta, unspecified, first trimester: Secondary | ICD-10-CM | POA: Insufficient documentation

## 2024-01-25 DIAGNOSIS — O209 Hemorrhage in early pregnancy, unspecified: Secondary | ICD-10-CM

## 2024-01-25 DIAGNOSIS — O0990 Supervision of high risk pregnancy, unspecified, unspecified trimester: Secondary | ICD-10-CM

## 2024-01-25 LAB — URINALYSIS, ROUTINE W REFLEX MICROSCOPIC
Bilirubin Urine: NEGATIVE
Glucose, UA: NEGATIVE mg/dL
Ketones, ur: 20 mg/dL — AB
Nitrite: NEGATIVE
Protein, ur: 30 mg/dL — AB
RBC / HPF: >50 RBC/hpf (ref 0–5)
Specific Gravity, Urine: 1.023 (ref 1.005–1.030)
pH: 5 (ref 5.0–8.0)

## 2024-01-25 LAB — OB RESULTS CONSOLE GBS: GBS: POSITIVE

## 2024-01-25 NOTE — MAU Provider Note (Addendum)
 Chief Complaint: Vaginal Bleeding   Event Date/Time   First Provider Initiated Contact with Patient 01/25/24 1814     SUBJECTIVE HPI: Jillian Browning is a 33 y.o. [id number] at [redacted]w[redacted]d who presents to Maternity Admissions reporting intermittent light- moderate bright red vaginal bleeding over the past 5 days. Bleeding is increasing. Denies abdominal pain, has right hip pain but had 2/10 lower abdominal pain yesterday. Has US  4/9 showing live SIUP.   Passage of tissue or clots: Denies Dizziness: Denies  B POS  [place] interpreter used.   Past Medical History:  Diagnosis Date   Tuberculosis 2016   CXR 2018, no active disease   OB History  Gravida Para Term Preterm AB Living  4 3 3  0 0 3  SAB IAB Ectopic Multiple Live Births  0 0 0 0 3    # Outcome Date GA Lbr Len/2nd Weight Sex Type Anes PTL Lv  4 Current           3 Term 09/30/20 [redacted]w[redacted]d 05:31 / 00:13 2695 g M VBAC EPI  LIV  2 Term 06/14/18 [redacted]w[redacted]d  2722 g  VBAC None    1 Term 06/05/16 [redacted]w[redacted]d  3500 g F CS-Unspec   LIV     Complications: Failure to Progress in First Stage   Past Surgical History:  Procedure Laterality Date   CESAREAN SECTION     Social History   Socioeconomic History   Marital status: Married    Spouse name: Not on file   Number of children: Not on file   Years of education: Not on file   Highest education level: Not on file  Occupational History   Not on file  Tobacco Use   Smoking status: Never   Smokeless tobacco: Never  Vaping Use   Vaping status: Never Used  Substance and Sexual Activity   Alcohol use: Never   Drug use: Never   Sexual activity: Yes    Birth control/protection: None  Other Topics Concern   Not on file  Social History Narrative   Not on file   Social Drivers of Health   Financial Resource Strain: Not on file  Food Insecurity: Not on file  Transportation Needs: Not on file  Physical Activity: Not on file  Stress: Not on file  Social Connections: Not on file  Intimate Partner  Violence: Not on file   No current facility-administered medications on file prior to encounter.   Current Outpatient Medications on File Prior to Encounter  Medication Sig Dispense Refill   acetaminophen  (TYLENOL ) 325 MG tablet Take 2 tablets (650 mg total) by mouth every 6 (six) hours as needed for mild pain, moderate pain, fever or headache (for pain scale < 4). (Patient not taking: Reported on 01/14/2024)     coconut oil OIL Apply 1 application topically as needed (nipple pain).  0   ibuprofen  (ADVIL ) 600 MG tablet Take 1 tablet (600 mg total) by mouth every 8 (eight) hours as needed for moderate pain or cramping. 30 tablet 0   Prenat-FeAsp-Meth-FA-DHA w/o A (PRENATE PIXIE ) 10-0.6-0.4-200 MG CAPS Take 1 capsule by mouth daily. 30 capsule 11   No Known Allergies  I have reviewed the past Medical Hx, Surgical Hx, Social Hx, Allergies and Medications.   Review of Systems  Constitutional:  Negative for chills and fever.  Gastrointestinal:  Negative for abdominal pain.  Genitourinary:  Positive for vaginal bleeding. Negative for dysuria, frequency, hematuria, urgency and vaginal discharge.  Neurological:  Negative for dizziness.  OBJECTIVE Patient Vitals for the past 24 hrs:  BP Temp Temp src Pulse Resp SpO2 Height Weight  01/25/24 1643 126/83 97.9 F (36.6 C) Oral (!) 101 18 100 % -- --  01/25/24 1631 -- -- -- -- -- -- 5' 4.17" (1.63 m) 64.2 kg   Constitutional: Well-developed, well-nourished female in no acute distress. Anxious.  Cardiovascular: normal rate Respiratory: normal rate and effort.  GI: Abd soft, non-tender. Fundus non-palpable MS: Extremities nontender, no edema, normal ROM Neurologic: Alert and oriented x 4.  GU:  SPECULUM EXAM: NEFG, physiologic discharge, small amount of blood noted, cervix friable with scant active bleeding. Prominent ectropion. No t C/W cervicitis.   BIMANUAL: cervix closed; uterus 10-week size, no adnexal tenderness or masses. No CMT.  LAB  RESULTS Results for orders placed or performed during the hospital encounter of 01/25/24 (from the past 24 hours)  Urinalysis, Routine w reflex microscopic -Urine, Clean Catch     Status: Abnormal   Collection Time: 01/25/24  4:54 PM  Result Value Ref Range   Color, Urine YELLOW YELLOW   APPearance HAZY (A) CLEAR   Specific Gravity, Urine 1.023 1.005 - 1.030   pH 5.0 5.0 - 8.0   Glucose, UA NEGATIVE NEGATIVE mg/dL   Hgb urine dipstick LARGE (A) NEGATIVE   Bilirubin Urine NEGATIVE NEGATIVE   Ketones, ur 20 (A) NEGATIVE mg/dL   Protein, ur 30 (A) NEGATIVE mg/dL   Nitrite NEGATIVE NEGATIVE   Leukocytes,Ua SMALL (A) NEGATIVE   RBC / HPF >50 0 - 5 RBC/hpf   WBC, UA 6-10 0 - 5 WBC/hpf   Bacteria, UA RARE (A) NONE SEEN   Squamous Epithelial / HPF 0-5 0 - 5 /HPF   Mucus PRESENT     IMAGING Pt informed that the ultrasound is considered a limited OB ultrasound and is not intended to be a complete ultrasound exam.  Patient also informed that the ultrasound is not being completed with the intent of assessing for fetal or placental anomalies or any pelvic abnormalities.  Explained that the purpose of today's ultrasound is to assess for  viability. FHR 183. Positive fetal mvmt.  Patient acknowledges the purpose of the exam and the limitations of the study.     MAU COURSE Orders Placed This Encounter  Procedures   Culture, OB Urine   Urinalysis, Routine w reflex microscopic -Urine, Clean Catch   Discharge patient  BS US    MDM Vaginal bleeding in early pregnancy with normal intrauterine pregnancy and hemodynamically stable. - Nml fetal HR. Scant active bleeding that appears to be from cervical ectropion. Recommend pelvic rest x 1 week.   ASSESSMENT 1. Supervision of high risk pregnancy, antepartum   2. First trimester bleeding     PLAN Discharge home in stable condition. bleeding precautions  Follow-up Information     [facility] Follow up on 02/11/2024.   Why: Start  prenatal care Contact information: [address] [place]  [address] [phone]        [facility] Follow up.   Specialty: Obstetrics and Gynecology Why: As needed in emergencies Contact information: [address] [place]  [address] [phone]               Allergies as of 01/25/2024   No Known Allergies      Medication List     STOP taking these medications    acetaminophen  325 MG tablet Commonly known as: Tylenol    coconut oil  Oil   ibuprofen  600 MG tablet Commonly known as: ADVIL        TAKE these medications    Prenate Pixie  10-0.6-0.4-200 MG Caps Take 1 capsule by mouth daily.         Felipe Horton, Eleni Frank , CNM 01/25/2024  6:53 PM  4

## 2024-01-25 NOTE — MAU Note (Signed)
 Jillian Browning is a 33 y.o. at [redacted]w[redacted]d here in MAU reporting: she's having VB that began earlier during the week.  States VB stopped but began again on Wednesday and has increased.  Reports VB is small to moderate, no cots noted. Denies recent intercourse.  Denies abdominal pain, has right hip pain but had lower abdominal pain yesterday.  LMP: 10/30/2023 Onset of complaint: Wednesday Pain score: 2 Vitals:   01/25/24 1643  BP: 126/83  Pulse: (!) 101  Resp: 18  Temp: 97.9 F (36.6 C)  SpO2: 100%     FHT: Attempted, none heard  Lab orders placed from triage: UA

## 2024-01-28 ENCOUNTER — Encounter: Payer: Self-pay | Admitting: Advanced Practice Midwife

## 2024-01-28 DIAGNOSIS — R8271 Bacteriuria: Secondary | ICD-10-CM | POA: Insufficient documentation

## 2024-01-28 LAB — CULTURE, OB URINE

## 2024-02-05 ENCOUNTER — Encounter: Admitting: Physician Assistant

## 2024-02-11 ENCOUNTER — Ambulatory Visit (INDEPENDENT_AMBULATORY_CARE_PROVIDER_SITE_OTHER): Admitting: Advanced Practice Midwife

## 2024-02-11 ENCOUNTER — Encounter: Payer: Self-pay | Admitting: Advanced Practice Midwife

## 2024-02-11 ENCOUNTER — Other Ambulatory Visit (HOSPITAL_COMMUNITY)
Admission: RE | Admit: 2024-02-11 | Discharge: 2024-02-11 | Disposition: A | Discharge status: Home / Self Care | Source: Ambulatory Visit | Attending: Advanced Practice Midwife | Admitting: Advanced Practice Midwife

## 2024-02-11 VITALS — BP 112/76 | HR 98 | Wt 143.2 lb

## 2024-02-11 DIAGNOSIS — Z3A12 12 weeks gestation of pregnancy: Secondary | ICD-10-CM | POA: Diagnosis not present

## 2024-02-11 DIAGNOSIS — Z8611 Personal history of tuberculosis: Secondary | ICD-10-CM

## 2024-02-11 DIAGNOSIS — D563 Thalassemia minor: Secondary | ICD-10-CM

## 2024-02-11 DIAGNOSIS — Z758 Other problems related to medical facilities and other health care: Secondary | ICD-10-CM

## 2024-02-11 DIAGNOSIS — Z3401 Encounter for supervision of normal first pregnancy, first trimester: Secondary | ICD-10-CM

## 2024-02-11 DIAGNOSIS — Z124 Encounter for screening for malignant neoplasm of cervix: Secondary | ICD-10-CM

## 2024-02-11 DIAGNOSIS — Z98891 History of uterine scar from previous surgery: Secondary | ICD-10-CM | POA: Diagnosis not present

## 2024-02-11 DIAGNOSIS — Z3481 Encounter for supervision of other normal pregnancy, first trimester: Secondary | ICD-10-CM | POA: Diagnosis not present

## 2024-02-11 DIAGNOSIS — Z603 Acculturation difficulty: Secondary | ICD-10-CM

## 2024-02-11 DIAGNOSIS — Z3490 Encounter for supervision of normal pregnancy, unspecified, unspecified trimester: Secondary | ICD-10-CM | POA: Diagnosis present

## 2024-02-11 NOTE — Progress Notes (Signed)
 Subjective:   Jillian Browning is a 33 y.o. [id number] at [redacted]w[redacted]d by early ultrasound being seen today for her first obstetrical visit.  Her obstetrical history is significant for  cesarean x 2 followed by VBAC x 2  and has Alpha thalassemia silent carrier; Language barrier affecting health care; History of tuberculosis; Previous cesarean section; VBAC (vaginal birth after Cesarean); Hemoglobin E trait (HCC); Supervision of low-risk pregnancy; and Group B streptococcal bacteriuria on their problem list.. Patient does intend to breast feed. Pregnancy history fully reviewed.  Patient reports no complaints.  HISTORY: OB History  Gravida Para Term Preterm AB Living  4 3 3  0 0 3  SAB IAB Ectopic Multiple Live Births  0 0 0 0 3    # Outcome Date GA Lbr Len/2nd Weight Sex Type Anes PTL Lv  4 Current           3 Term 09/30/20 [redacted]w[redacted]d 05:31 / 00:13 5 lb 15.1 oz (2.695 kg) M VBAC EPI  LIV     Name: Keeran,BOY Imara     Apgar1: 8  Apgar5: 8  2 Term 06/14/18 [redacted]w[redacted]d  6 lb (2.722 kg)  VBAC None    1 Term 06/05/16 [redacted]w[redacted]d  7 lb 11.5 oz (3.5 kg) F CS-Unspec   LIV     Complications: Failure to Progress in First Stage   Past Medical History:  Diagnosis Date   Tuberculosis 2016   CXR 2018, no active disease   Past Surgical History:  Procedure Laterality Date   CESAREAN SECTION     Family History  Problem Relation Age of Onset   Healthy Mother    Healthy Father    Social History   Tobacco Use   Smoking status: Never   Smokeless tobacco: Never  Vaping Use   Vaping status: Never Used  Substance Use Topics   Alcohol use: Never   Drug use: Never   No Known Allergies Current Outpatient Medications on File Prior to Visit  Medication Sig Dispense Refill   Prenat-FeAsp-Meth-FA-DHA w/o A (PRENATE PIXIE ) 10-0.6-0.4-200 MG CAPS Take 1 capsule by mouth daily. (Patient not taking: Reported on 02/11/2024) 30 capsule 11   No current facility-administered medications on file prior to visit.     Exam   Vitals:    02/11/24 0822  BP: 112/76  Pulse: 98  Weight: 143 lb 3.2 oz (65 kg)   Fetal Heart Rate (bpm): 161  Uterus:     Pelvic Exam: Perineum: no hemorrhoids, normal perineum   Vulva: normal external genitalia, no lesions   Vagina:  normal mucosa, normal discharge   Cervix: no lesions and normal, pap smear done.    Adnexa: normal adnexa and no mass, fullness, tenderness   Bony Pelvis: average  System: General: well-developed, well-nourished female in no acute distress   Breast:  normal appearance, no masses or tenderness   Skin: normal coloration and turgor, no rashes   Neurologic: oriented, normal, negative, normal mood   Extremities: normal strength, tone, and muscle mass, ROM of all joints is normal   HEENT PERRLA, extraocular movement intact and sclera clear, anicteric   Mouth/Teeth mucous membranes moist, pharynx normal without lesions and dental hygiene good   Neck supple and no masses   Cardiovascular: regular rate and rhythm   Respiratory:  no respiratory distress, normal breath sounds   Abdomen: soft, non-tender; bowel sounds normal; no masses,  no organomegaly     Assessment:   Pregnancy: [id number] Patient Active Problem List  Diagnosis Date Noted   Group B streptococcal bacteriuria 01/28/2024   Supervision of low-risk pregnancy 01/15/2024   Hemoglobin E trait (HCC) 08/02/2020   VBAC (vaginal birth after Cesarean) 07/15/2018   Language barrier affecting health care 02/05/2018   History of tuberculosis 02/05/2018   Previous cesarean section 02/05/2018   Alpha thalassemia silent carrier 01/29/2018     Plan:     Initial labs drawn. Continue prenatal vitamins. Discussed and offered genetic screening options, including Quad screen/AFP, NIPS testing, and option to decline testing. Benefits/risks/alternatives reviewed. Pt aware that anatomy US  is form of genetic screening with lower accuracy in detecting trisomies than blood work.  Pt chooses genetic screening today. NIPS:  ordered. Horizon test done in 2021, positive for alpha thalassemia carrier. Pt declines partner testing and counseling.  Ultrasound discussed; fetal anatomic survey: ordered. Problem list reviewed and updated. The nature of [facility] with multiple MDs and other Advanced Practice Providers was explained to patient; also emphasized that residents, students are part of our team. Routine obstetric precautions reviewed. Return in about 4 weeks (around 03/10/2024) for LOB, Any provider.   Arlester Bence, CNM 02/11/24 12:46 PM

## 2024-02-11 NOTE — Progress Notes (Signed)
 Pt states she was experiencing some vaginal bleeding, went to the ER on 01/25/2024, she states that everything is now okay and she is no longer bleeding.  Have not picked up prenatal vit yet.

## 2024-02-12 LAB — CBC/D/PLT+RPR+RH+ABO+RUBIGG...
Antibody Screen: NEGATIVE
Basophils Absolute: 0 10*3/uL (ref 0.0–0.2)
Basos: 0 %
EOS (ABSOLUTE): 0.2 10*3/uL (ref 0.0–0.4)
Eos: 2 %
HCV Ab: NONREACTIVE
HIV Screen 4th Generation wRfx: NONREACTIVE
Hematocrit: 41.2 % (ref 34.0–46.6)
Hemoglobin: 12.8 g/dL (ref 11.1–15.9)
Hepatitis B Surface Ag: NEGATIVE
Immature Grans (Abs): 0 10*3/uL (ref 0.0–0.1)
Immature Granulocytes: 0 %
Lymphocytes Absolute: 1.8 10*3/uL (ref 0.7–3.1)
Lymphs: 22 %
MCH: 23.1 pg — ABNORMAL LOW (ref 26.6–33.0)
MCHC: 31.1 g/dL — ABNORMAL LOW (ref 31.5–35.7)
MCV: 75 fL — ABNORMAL LOW (ref 79–97)
Monocytes Absolute: 0.4 10*3/uL (ref 0.1–0.9)
Monocytes: 5 %
Neutrophils Absolute: 5.7 10*3/uL (ref 1.4–7.0)
Neutrophils: 71 %
Platelets: 258 10*3/uL (ref 150–450)
RBC: 5.53 x10E6/uL — ABNORMAL HIGH (ref 3.77–5.28)
RDW: 16 % — ABNORMAL HIGH (ref 11.7–15.4)
RPR Ser Ql: NONREACTIVE
Rh Factor: POSITIVE
Rubella Antibodies, IGG: 31.1 {index} (ref >0.99)
WBC: 8.1 10*3/uL (ref 3.4–10.8)

## 2024-02-12 LAB — CYTOLOGY - PAP
Comment: NEGATIVE
Diagnosis: NEGATIVE
High risk HPV: NEGATIVE

## 2024-02-12 LAB — CERVICOVAGINAL ANCILLARY ONLY
Chlamydia: NEGATIVE
Comment: NEGATIVE
Comment: NEGATIVE
Comment: NORMAL
Neisseria Gonorrhea: NEGATIVE
Trichomonas: NEGATIVE

## 2024-02-12 LAB — HCV INTERPRETATION

## 2024-02-16 LAB — PANORAMA PRENATAL TEST FULL PANEL:PANORAMA TEST PLUS 5 ADDITIONAL MICRODELETIONS: FETAL FRACTION: 5.4

## 2024-03-10 ENCOUNTER — Ambulatory Visit (INDEPENDENT_AMBULATORY_CARE_PROVIDER_SITE_OTHER): Admitting: Obstetrics and Gynecology

## 2024-03-10 VITALS — BP 112/75 | HR 112 | Wt 145.0 lb

## 2024-03-10 DIAGNOSIS — Z758 Other problems related to medical facilities and other health care: Secondary | ICD-10-CM

## 2024-03-10 DIAGNOSIS — Z98891 History of uterine scar from previous surgery: Secondary | ICD-10-CM

## 2024-03-10 DIAGNOSIS — Z3492 Encounter for supervision of normal pregnancy, unspecified, second trimester: Secondary | ICD-10-CM

## 2024-03-10 DIAGNOSIS — R8271 Bacteriuria: Secondary | ICD-10-CM

## 2024-03-10 DIAGNOSIS — O34219 Maternal care for unspecified type scar from previous cesarean delivery: Secondary | ICD-10-CM

## 2024-03-10 DIAGNOSIS — D582 Other hemoglobinopathies: Secondary | ICD-10-CM

## 2024-03-10 DIAGNOSIS — Z3A16 16 weeks gestation of pregnancy: Secondary | ICD-10-CM

## 2024-03-10 DIAGNOSIS — Z603 Acculturation difficulty: Secondary | ICD-10-CM

## 2024-03-10 DIAGNOSIS — D563 Thalassemia minor: Secondary | ICD-10-CM

## 2024-03-10 NOTE — Progress Notes (Signed)
 Denies problems today.

## 2024-03-10 NOTE — Progress Notes (Signed)
   PRENATAL VISIT NOTE  Subjective:  Jillian Browning is a 33 y.o. [id number] at [redacted]w[redacted]d being seen today for ongoing prenatal care.  She is currently monitored for the following issues for this low-risk pregnancy and has Alpha thalassemia silent carrier; Language barrier affecting health care; History of tuberculosis; Previous cesarean section; VBAC (vaginal birth after Cesarean); Hemoglobin E trait (HCC); Supervision of low-risk pregnancy; and Group B streptococcal bacteriuria on their problem list.  Patient reports no complaints.  Contractions: Not present. Vag. Bleeding: None.  Movement: Present. Denies leaking of fluid.   The following portions of the patient's history were reviewed and updated as appropriate: allergies, current medications, past family history, past medical history, past social history, past surgical history and problem list.   Objective:   Vitals:   03/10/24 0844  BP: 112/75  Pulse: (!) 112  Weight: 145 lb (65.8 kg)    Fetal Status: Fetal Heart Rate (bpm): 154   Movement: Present     General:  Alert, oriented and cooperative. Patient is in no acute distress.  Skin: Skin is warm and dry. No rash noted.   Cardiovascular: Normal heart rate noted  Respiratory: Normal respiratory effort, no problems with respiration noted  Abdomen: Soft, gravid, appropriate for gestational age.  Pain/Pressure: Absent      Assessment and Plan:  Pregnancy: G4P3003 at [redacted]w[redacted]d 1. Encounter for supervision of low-risk pregnancy in second trimester (Primary) 2. [redacted] weeks gestation of pregnancy AFP & A1c discussed. Declines AFP A1c ordered  3. Previous cesarean section 4. VBAC (vaginal birth after Cesarean) G1 CS failure to progress > 2 successful VBACs For TOLAC  5. Alpha thalassemia silent carrier 6. Hemoglobin E trait (HCC) Declined partner testing/genetic counseling  7. Group B streptococcal bacteriuria PCN in labor  8. Language barrier affecting health care Interviewed/counseled with  [place] interpreter  Please refer to After Visit Summary for other counseling recommendations.   Return in about 4 weeks (around 04/07/2024) for return OB at 20 weeks.  No future appointments.  Izell Marsh, MD

## 2024-03-11 ENCOUNTER — Ambulatory Visit: Payer: Self-pay | Admitting: Obstetrics and Gynecology

## 2024-03-11 DIAGNOSIS — Z3492 Encounter for supervision of normal pregnancy, unspecified, second trimester: Secondary | ICD-10-CM

## 2024-03-11 LAB — HEMOGLOBIN A1C
Est. average glucose Bld gHb Est-mCnc: 88 mg/dL
Hgb A1c MFr Bld: 4.7 % — ABNORMAL LOW (ref 4.8–5.6)

## 2024-04-07 ENCOUNTER — Encounter: Payer: Self-pay | Admitting: Obstetrics

## 2024-04-07 ENCOUNTER — Ambulatory Visit (INDEPENDENT_AMBULATORY_CARE_PROVIDER_SITE_OTHER): Admitting: Obstetrics

## 2024-04-07 VITALS — BP 106/73 | HR 101 | Wt 150.6 lb

## 2024-04-07 DIAGNOSIS — O34219 Maternal care for unspecified type scar from previous cesarean delivery: Secondary | ICD-10-CM | POA: Diagnosis not present

## 2024-04-07 DIAGNOSIS — Z603 Acculturation difficulty: Secondary | ICD-10-CM

## 2024-04-07 DIAGNOSIS — Z98891 History of uterine scar from previous surgery: Secondary | ICD-10-CM

## 2024-04-07 DIAGNOSIS — D563 Thalassemia minor: Secondary | ICD-10-CM

## 2024-04-07 DIAGNOSIS — R8271 Bacteriuria: Secondary | ICD-10-CM

## 2024-04-07 DIAGNOSIS — Z3492 Encounter for supervision of normal pregnancy, unspecified, second trimester: Secondary | ICD-10-CM

## 2024-04-07 DIAGNOSIS — D582 Other hemoglobinopathies: Secondary | ICD-10-CM

## 2024-04-07 DIAGNOSIS — Z758 Other problems related to medical facilities and other health care: Secondary | ICD-10-CM

## 2024-04-07 NOTE — Progress Notes (Signed)
 Pt presents for ROB. No questions or concerns today.

## 2024-04-07 NOTE — Progress Notes (Signed)
 Subjective:  Jillian Browning is a 33 y.o. H5E6996 at [redacted]w[redacted]d being seen today for ongoing prenatal care.  She is currently monitored for the following issues for this low-risk pregnancy and has Alpha thalassemia silent carrier; Language barrier affecting health care; History of tuberculosis; Previous cesarean section; VBAC (vaginal birth after Cesarean); Hemoglobin E trait (HCC); Supervision of low-risk pregnancy; and Group B streptococcal bacteriuria on their problem list.  Patient reports no complaints.  Contractions: Not present. Vag. Bleeding: None.  Movement: Present. Denies leaking of fluid.   The following portions of the patient's history were reviewed and updated as appropriate: allergies, current medications, past family history, past medical history, past social history, past surgical history and problem list. Problem list updated.  Objective:   Vitals:   04/07/24 0815  BP: 106/73  Pulse: (!) 101  Weight: 150 lb 9.6 oz (68.3 kg)    Fetal Status: Fetal Heart Rate (bpm): 153   Movement: Present     General:  Alert, oriented and cooperative. Patient is in no acute distress.  Skin: Skin is warm and dry. No rash noted.   Cardiovascular: Normal heart rate noted  Respiratory: Normal respiratory effort, no problems with respiration noted  Abdomen: Soft, gravid, appropriate for gestational age. Pain/Pressure: Absent     Pelvic:  Cervical exam deferred        Extremities: Normal range of motion.  Edema: None  Mental Status: Normal mood and affect. Normal behavior. Normal judgment and thought content.   Urinalysis:      Assessment and Plan:  Pregnancy: G4P3003 at [redacted]w[redacted]d  There are no diagnoses linked to this encounter. Preterm labor symptoms and general obstetric precautions including but not limited to vaginal bleeding, contractions, leaking of fluid and fetal movement were reviewed in detail with the patient. Please refer to After Visit Summary for other counseling recommendations.   Return  in about 4 weeks (around 05/05/2024) for ROB.   Rudy Carlin LABOR, MD 7/1/`2025

## 2024-04-16 ENCOUNTER — Other Ambulatory Visit

## 2024-04-16 ENCOUNTER — Ambulatory Visit: Attending: Obstetrics and Gynecology | Admitting: Obstetrics

## 2024-04-16 VITALS — BP 100/70 | HR 100

## 2024-04-16 DIAGNOSIS — Z363 Encounter for antenatal screening for malformations: Secondary | ICD-10-CM | POA: Insufficient documentation

## 2024-04-16 DIAGNOSIS — Z98891 History of uterine scar from previous surgery: Secondary | ICD-10-CM

## 2024-04-16 DIAGNOSIS — O34219 Maternal care for unspecified type scar from previous cesarean delivery: Secondary | ICD-10-CM | POA: Insufficient documentation

## 2024-04-16 DIAGNOSIS — O09892 Supervision of other high risk pregnancies, second trimester: Secondary | ICD-10-CM | POA: Diagnosis present

## 2024-04-16 DIAGNOSIS — D582 Other hemoglobinopathies: Secondary | ICD-10-CM | POA: Diagnosis not present

## 2024-04-16 DIAGNOSIS — O358XX0 Maternal care for other (suspected) fetal abnormality and damage, not applicable or unspecified: Secondary | ICD-10-CM | POA: Insufficient documentation

## 2024-04-16 DIAGNOSIS — Z148 Genetic carrier of other disease: Secondary | ICD-10-CM | POA: Diagnosis not present

## 2024-04-16 DIAGNOSIS — Z3A21 21 weeks gestation of pregnancy: Secondary | ICD-10-CM | POA: Diagnosis not present

## 2024-04-16 DIAGNOSIS — O0990 Supervision of high risk pregnancy, unspecified, unspecified trimester: Secondary | ICD-10-CM

## 2024-04-16 DIAGNOSIS — D563 Thalassemia minor: Secondary | ICD-10-CM | POA: Diagnosis not present

## 2024-04-16 DIAGNOSIS — O321XX0 Maternal care for breech presentation, not applicable or unspecified: Secondary | ICD-10-CM | POA: Diagnosis not present

## 2024-04-16 DIAGNOSIS — Z3492 Encounter for supervision of normal pregnancy, unspecified, second trimester: Secondary | ICD-10-CM

## 2024-04-16 NOTE — Progress Notes (Signed)
 MFM Consult Note  Jillian Browning is currently at 21 weeks and 5 days.  She was seen for a detailed fetal anatomy scan.  She denies any significant past medical history and denies any problems in her current pregnancy.    She had a cell free DNA test earlier in her pregnancy which indicated a low risk for trisomy 36, 87, and 13. A female fetus is predicted.  She has been identified as a carrier for the hemoglobin E trait and as a silent carrier for alpha thalassemia.    She was informed that the fetal growth and amniotic fluid level were appropriate for her gestational age.   There were no obvious fetal anomalies noted on today's ultrasound exam.  The patient was informed that anomalies may be missed due to technical limitations. If the fetus is in a suboptimal position or maternal habitus is increased, visualization of the fetus in the maternal uterus may be impaired.  The patient's husband reports that this is their fourth child together he is not concerned regarding her carrier status for hemoglobin E and alpha thalassemia.    No further exams were scheduled in our office.    All conversations were held with the patient today with the help of a [place] interpreter.    The patient stated that all of her questions were answered today.  A total of 30 minutes was spent counseling and coordinating the care for this patient.  Greater than 50% of the time was spent in direct face-to-face contact.

## 2024-05-05 ENCOUNTER — Ambulatory Visit (INDEPENDENT_AMBULATORY_CARE_PROVIDER_SITE_OTHER): Admitting: Physician Assistant

## 2024-05-05 VITALS — BP 123/74 | HR 121 | Wt 152.5 lb

## 2024-05-05 DIAGNOSIS — Z3492 Encounter for supervision of normal pregnancy, unspecified, second trimester: Secondary | ICD-10-CM | POA: Diagnosis not present

## 2024-05-05 DIAGNOSIS — Z3A24 24 weeks gestation of pregnancy: Secondary | ICD-10-CM

## 2024-05-05 NOTE — Progress Notes (Signed)
 Pt presents for rob. Pt has no questions or concerns at this time.

## 2024-05-05 NOTE — Progress Notes (Signed)
   PRENATAL VISIT NOTE  Subjective:  Jillian Browning is a 33 y.o. H5E6996 at [redacted]w[redacted]d being seen today for ongoing prenatal care.  She is currently monitored for the following issues for this low-risk pregnancy and has Alpha thalassemia silent carrier; Language barrier affecting health care; History of tuberculosis; Previous cesarean section; VBAC (vaginal birth after Cesarean); Hemoglobin E trait (HCC); Supervision of low-risk pregnancy; and Group B streptococcal bacteriuria on their problem list.  Patient reports no complaints.  Contractions: Not present. Vag. Bleeding: None.  Movement: Present. Denies leaking of fluid.   The following portions of the patient's history were reviewed and updated as appropriate: allergies, current medications, past family history, past medical history, past social history, past surgical history and problem list.   Objective:    Vitals:   05/05/24 0834  BP: 123/74  Pulse: (!) 121  Weight: 152 lb 8 oz (69.2 kg)    Fetal Status:  Fetal Heart Rate (bpm): 156 Fundal Height: 23 cm Movement: Present    General: Alert, oriented and cooperative. Patient is in no acute distress.  Skin: Skin is warm and dry. No rash noted.   Cardiovascular: Normal heart rate noted  Respiratory: Normal respiratory effort, no problems with respiration noted  Abdomen: Soft, gravid, appropriate for gestational age.  Pain/Pressure: Present     Pelvic: Cervical exam deferred        Extremities: Normal range of motion.  Edema: None  Mental Status: Normal mood and affect. Normal behavior. Normal judgment and thought content.   Assessment and Plan:  Pregnancy: G4P3003 at [redacted]w[redacted]d  1. Encounter for supervision of low-risk pregnancy in second trimester (Primary) Patient doing well, feeling regular fetal movement  BP, FHR, FH appropriate  2. [redacted] weeks gestation of pregnancy Anticipatory guidance about next visits/weeks of pregnancy given.  Discussed fasting for 2hr GTT next visit  Preterm labor  symptoms and general obstetric precautions including but not limited to vaginal bleeding, contractions, leaking of fluid and fetal movement were reviewed in detail with the patient.  Please refer to After Visit Summary for other counseling recommendations.   Return in about 4 weeks (around 06/02/2024) for LOB+GTT.  Future Appointments  Date Time Provider Department Center  05/19/2024 10:15 AM Tami Blass E, PA-C [facility] None     Arif Amendola E Lucy Boardman, PA-C

## 2024-05-19 ENCOUNTER — Encounter: Admitting: Physician Assistant

## 2024-06-02 ENCOUNTER — Other Ambulatory Visit: Payer: Self-pay

## 2024-06-02 ENCOUNTER — Ambulatory Visit: Admitting: Obstetrics and Gynecology

## 2024-06-02 VITALS — BP 106/73 | HR 99 | Wt 161.0 lb

## 2024-06-02 DIAGNOSIS — Z3492 Encounter for supervision of normal pregnancy, unspecified, second trimester: Secondary | ICD-10-CM

## 2024-06-02 DIAGNOSIS — Z603 Acculturation difficulty: Secondary | ICD-10-CM | POA: Diagnosis not present

## 2024-06-02 DIAGNOSIS — Z3493 Encounter for supervision of normal pregnancy, unspecified, third trimester: Secondary | ICD-10-CM

## 2024-06-02 DIAGNOSIS — Z98891 History of uterine scar from previous surgery: Secondary | ICD-10-CM | POA: Diagnosis not present

## 2024-06-02 DIAGNOSIS — Z758 Other problems related to medical facilities and other health care: Secondary | ICD-10-CM

## 2024-06-02 NOTE — Progress Notes (Signed)
   PRENATAL VISIT NOTE  Subjective:  Jillian Browning is a 33 y.o. H5E6996 at [redacted]w[redacted]d being seen today for ongoing prenatal care.  She is currently monitored for the following issues for this low-risk pregnancy and has Alpha thalassemia silent carrier; Language barrier affecting health care; History of tuberculosis; Previous cesarean section; VBAC (vaginal birth after Cesarean); Hemoglobin E trait (HCC); Supervision of low-risk pregnancy; and Group B streptococcal bacteriuria on their problem list.  Patient reports no complaints.  Feels a bit dizzy from the glucose test but otherwise well. Contractions: Not present. Vag. Bleeding: None.  Movement: Present. Denies leaking of fluid.   The following portions of the patient's history were reviewed and updated as appropriate: allergies, current medications, past family history, past medical history, past social history, past surgical history and problem list.   Objective:   Vitals:   06/02/24 0844  BP: 106/73  Pulse: 99  Weight: 161 lb (73 kg)   Body mass index is 27.49 kg/m. Total weight gain: 24 lb (10.9 kg)   Fetal Status: Fetal Heart Rate (bpm): 150 Fundal Height: 27 cm Movement: Present     General:  Alert, oriented and cooperative. Patient is in no acute distress.  Skin: Skin is warm and dry. No rash noted.   Cardiovascular: Normal heart rate noted  Respiratory: Normal respiratory effort, no problems with respiration noted  Abdomen: Soft, gravid, appropriate for gestational age.  Pain/Pressure: Absent     Pelvic: Cervical exam deferred        Extremities: Normal range of motion.     Mental Status: Normal mood and affect. Normal behavior. Normal judgment and thought content.   Assessment and Plan:  Pregnancy: G4P3003 at [redacted]w[redacted]d 1. Encounter for supervision of low-risk pregnancy in third trimester (Primary) Third trimester labs Fetal kick counts reviewed Declines tdap Plans to breastfeed  2. Previous cesarean section VBAC planned  3.  Language barrier affecting health care Patient declines official interpreter and elects family member  Preterm labor symptoms and general obstetric precautions including but not limited to vaginal bleeding, contractions, leaking of fluid and fetal movement were reviewed in detail with the patient. Please refer to After Visit Summary for other counseling recommendations.   Return in about 2 weeks (around 06/16/2024).  No future appointments.  Rollo ONEIDA Bring, MD

## 2024-06-03 ENCOUNTER — Ambulatory Visit: Payer: Self-pay | Admitting: Obstetrics and Gynecology

## 2024-06-03 DIAGNOSIS — Z3493 Encounter for supervision of normal pregnancy, unspecified, third trimester: Secondary | ICD-10-CM

## 2024-06-03 LAB — CBC
Hematocrit: 43.4 % (ref 34.0–46.6)
Hemoglobin: 13.2 g/dL (ref 11.1–15.9)
MCH: 24.4 pg — ABNORMAL LOW (ref 26.6–33.0)
MCHC: 30.4 g/dL — ABNORMAL LOW (ref 31.5–35.7)
MCV: 80 fL (ref 79–97)
Platelets: 245 x10E3/uL (ref 150–450)
RBC: 5.41 x10E6/uL — ABNORMAL HIGH (ref 3.77–5.28)
RDW: 13.7 % (ref 11.7–15.4)
WBC: 14.1 x10E3/uL — ABNORMAL HIGH (ref 3.4–10.8)

## 2024-06-03 LAB — HIV ANTIBODY (ROUTINE TESTING W REFLEX): HIV Screen 4th Generation wRfx: NONREACTIVE

## 2024-06-03 LAB — GLUCOSE TOLERANCE, 2 HOURS W/ 1HR
Glucose, 1 hour: 170 mg/dL (ref 70–179)
Glucose, 2 hour: 106 mg/dL (ref 70–152)
Glucose, Fasting: 79 mg/dL (ref 70–91)

## 2024-06-03 LAB — RPR: RPR Ser Ql: NONREACTIVE

## 2024-06-16 ENCOUNTER — Ambulatory Visit: Admitting: Obstetrics and Gynecology

## 2024-06-16 ENCOUNTER — Encounter: Payer: Self-pay | Admitting: Obstetrics and Gynecology

## 2024-06-16 VITALS — BP 106/72 | HR 101 | Wt 168.0 lb

## 2024-06-16 DIAGNOSIS — Z758 Other problems related to medical facilities and other health care: Secondary | ICD-10-CM

## 2024-06-16 DIAGNOSIS — Z603 Acculturation difficulty: Secondary | ICD-10-CM | POA: Diagnosis not present

## 2024-06-16 DIAGNOSIS — Z3493 Encounter for supervision of normal pregnancy, unspecified, third trimester: Secondary | ICD-10-CM

## 2024-06-16 DIAGNOSIS — O34219 Maternal care for unspecified type scar from previous cesarean delivery: Secondary | ICD-10-CM

## 2024-06-16 DIAGNOSIS — R8271 Bacteriuria: Secondary | ICD-10-CM | POA: Diagnosis not present

## 2024-06-16 NOTE — Progress Notes (Addendum)
   PRENATAL VISIT NOTE  Subjective:  Jillian Browning is a 33 y.o. H5E6996 at [redacted]w[redacted]d being seen today for ongoing prenatal care.  She is currently monitored for the following issues for this low-risk pregnancy and has Alpha thalassemia silent carrier; Language barrier affecting health care; History of tuberculosis; Previous cesarean section; VBAC (vaginal birth after Cesarean); Hemoglobin E trait (HCC); Supervision of low-risk pregnancy; and Group B streptococcal bacteriuria on their problem list.  Patient reports no complaints.  Contractions: Not present. Vag. Bleeding: None.  Movement: Present. Denies leaking of fluid.   The following portions of the patient's history were reviewed and updated as appropriate: allergies, current medications, past family history, past medical history, past social history, past surgical history and problem list.   Objective:    Vitals:   06/16/24 1451  BP: 106/72  Pulse: (!) 101  Weight: 168 lb (76.2 kg)    Fetal Status:  Fetal Heart Rate (bpm): 155 Fundal Height: 30 cm Movement: Present    General: Alert, oriented and cooperative. Patient is in no acute distress.  Skin: Skin is warm and dry. No rash noted.   Cardiovascular: Normal heart rate noted  Respiratory: Normal respiratory effort, no problems with respiration noted  Abdomen: Soft, gravid, appropriate for gestational age.  Pain/Pressure: Absent     Pelvic: Cervical exam deferred        Extremities: Normal range of motion.     Mental Status: Normal mood and affect. Normal behavior. Normal judgment and thought content.   Assessment and Plan:  Pregnancy: G4P3003 at [redacted]w[redacted]d 1. Encounter for supervision of low-risk pregnancy in third trimester (Primary) Patient is doing well without complaints Patient plans natural family planning for contraception  2. VBAC (vaginal birth after Cesarean) Patient plans another TOLAC  3. Group B streptococcal bacteriuria Prophylaxis in labor  4. Language barrier  affecting health care [place] interpreter present  Preterm labor symptoms and general obstetric precautions including but not limited to vaginal bleeding, contractions, leaking of fluid and fetal movement were reviewed in detail with the patient. Please refer to After Visit Summary for other counseling recommendations.   Return in about 2 weeks (around 06/30/2024) for in person, ROB, Low risk.  Future Appointments  Date Time Provider Department Center  06/30/2024  9:35 AM Delores Nidia CROME, FNP [facility] None  07/14/2024  8:55 AM Delores Nidia CROME, FNP [facility] None  07/28/2024  8:55 AM Nicholaus Burnard HERO, MD [facility] None  08/04/2024  8:55 AM Erik Kieth BROCKS, MD [facility] None    Winton Felt, MD

## 2024-06-30 ENCOUNTER — Ambulatory Visit (INDEPENDENT_AMBULATORY_CARE_PROVIDER_SITE_OTHER): Admitting: Obstetrics and Gynecology

## 2024-06-30 VITALS — BP 122/83 | HR 114 | Wt 161.6 lb

## 2024-06-30 DIAGNOSIS — Z98891 History of uterine scar from previous surgery: Secondary | ICD-10-CM

## 2024-06-30 DIAGNOSIS — Z3493 Encounter for supervision of normal pregnancy, unspecified, third trimester: Secondary | ICD-10-CM

## 2024-06-30 DIAGNOSIS — R8271 Bacteriuria: Secondary | ICD-10-CM

## 2024-06-30 DIAGNOSIS — Z3A32 32 weeks gestation of pregnancy: Secondary | ICD-10-CM | POA: Diagnosis not present

## 2024-06-30 DIAGNOSIS — Z603 Acculturation difficulty: Secondary | ICD-10-CM | POA: Diagnosis not present

## 2024-06-30 DIAGNOSIS — O34219 Maternal care for unspecified type scar from previous cesarean delivery: Secondary | ICD-10-CM

## 2024-06-30 DIAGNOSIS — Z758 Other problems related to medical facilities and other health care: Secondary | ICD-10-CM

## 2024-06-30 NOTE — Progress Notes (Signed)
   PRENATAL VISIT NOTE  Subjective:  Jillian Browning is a 33 y.o. H5E6996 at [redacted]w[redacted]d being seen today for ongoing prenatal care.  She is currently monitored for the following issues for this low-risk pregnancy and has Alpha thalassemia silent carrier; Language barrier affecting health care; History of tuberculosis; Previous cesarean section; VBAC (vaginal birth after Cesarean); Hemoglobin E trait; Supervision of low-risk pregnancy; and Group B streptococcal bacteriuria on their problem list.  Patient reports no complaints.  Contractions: Not present. Vag. Bleeding: None.  Movement: Present. Denies leaking of fluid.   The following portions of the patient's history were reviewed and updated as appropriate: allergies, current medications, past family history, past medical history, past social history, past surgical history and problem list.   Objective:    Vitals:   06/30/24 0957  BP: 122/83  Pulse: (!) 114  Weight: 161 lb 9.6 oz (73.3 kg)    Fetal Status:  Fetal Heart Rate (bpm): 150   Movement: Present    General: Alert, oriented and cooperative. Patient is in no acute distress.  Skin: Skin is warm and dry. No rash noted.   Cardiovascular: Normal heart rate noted  Respiratory: Normal respiratory effort, no problems with respiration noted  Abdomen: Soft, gravid, appropriate for gestational age.  Pain/Pressure: Absent     Pelvic: Cervical exam deferred        Extremities: Normal range of motion.  Edema: None  Mental Status: Normal mood and affect. Normal behavior. Normal judgment and thought content.   Assessment and Plan:  Pregnancy: G4P3003 at 108w3d 1. Encounter for supervision of low-risk pregnancy in third trimester (Primary) BP and FHR normal Doing well, feeling regular movement    2. [redacted] weeks gestation of pregnancy   3. Previous cesarean section 4. VBAC (vaginal birth after Cesarean) Desires vbac, consent today, will speak with MD    5. Group B streptococcal bacteriuria Tx in  labor   6. Language barrier affecting health care  Speaks some english, husband interprets  Preterm labor symptoms and general obstetric precautions including but not limited to vaginal bleeding, contractions, leaking of fluid and fetal movement were reviewed in detail with the patient. Please refer to After Visit Summary for other counseling recommendations.     Future Appointments  Date Time Provider Department Center  07/14/2024  8:55 AM Delores Nidia CROME, FNP [facility] None  07/28/2024  8:55 AM Nicholaus Burnard HERO, MD [facility] None  08/04/2024  8:55 AM Erik Kieth BROCKS, MD [facility] None    Nidia Delores, FNP

## 2024-06-30 NOTE — Progress Notes (Signed)
 Pt presents for rob. Pt has no questions or concerns at this time.

## 2024-07-14 ENCOUNTER — Ambulatory Visit: Admitting: Obstetrics and Gynecology

## 2024-07-14 ENCOUNTER — Encounter: Payer: Self-pay | Admitting: Obstetrics and Gynecology

## 2024-07-14 VITALS — BP 110/79 | HR 97 | Wt 164.6 lb

## 2024-07-14 DIAGNOSIS — Z758 Other problems related to medical facilities and other health care: Secondary | ICD-10-CM

## 2024-07-14 DIAGNOSIS — O34219 Maternal care for unspecified type scar from previous cesarean delivery: Secondary | ICD-10-CM

## 2024-07-14 DIAGNOSIS — Z603 Acculturation difficulty: Secondary | ICD-10-CM | POA: Diagnosis not present

## 2024-07-14 DIAGNOSIS — R8271 Bacteriuria: Secondary | ICD-10-CM | POA: Diagnosis not present

## 2024-07-14 DIAGNOSIS — Z3A34 34 weeks gestation of pregnancy: Secondary | ICD-10-CM | POA: Diagnosis not present

## 2024-07-14 DIAGNOSIS — Z98891 History of uterine scar from previous surgery: Secondary | ICD-10-CM

## 2024-07-14 DIAGNOSIS — Z3493 Encounter for supervision of normal pregnancy, unspecified, third trimester: Secondary | ICD-10-CM | POA: Diagnosis not present

## 2024-07-14 NOTE — Progress Notes (Signed)
 Pt states everything is going well. No concerns at this time.

## 2024-07-14 NOTE — Progress Notes (Signed)
   PRENATAL VISIT NOTE  Subjective:  Jillian Browning is a 33 y.o. H5E6996 at [redacted]w[redacted]d being seen today for ongoing prenatal care.  She is currently monitored for the following issues for this low-risk pregnancy and has Alpha thalassemia silent carrier; Language barrier affecting health care; History of tuberculosis; Previous cesarean section; VBAC (vaginal birth after Cesarean); Hemoglobin E trait; Supervision of low-risk pregnancy; and Group B streptococcal bacteriuria on their problem list.  Patient reports no complaints.  Contractions: Irritability. Vag. Bleeding: None.  Movement: Increased. Denies leaking of fluid.   The following portions of the patient's history were reviewed and updated as appropriate: allergies, current medications, past family history, past medical history, past social history, past surgical history and problem list.   Objective:    Vitals:   07/14/24 0851  BP: 110/79  Pulse: 97  Weight: 164 lb 9.6 oz (74.7 kg)    Fetal Status:  Fetal Heart Rate (bpm): 137 Fundal Height: 33 cm Movement: Increased    General: Alert, oriented and cooperative. Patient is in no acute distress.  Skin: Skin is warm and dry. No rash noted.   Cardiovascular: Normal heart rate noted  Respiratory: Normal respiratory effort, no problems with respiration noted  Abdomen: Soft, gravid, appropriate for gestational age.  Pain/Pressure: Absent     Pelvic: Cervical exam deferred        Extremities: Normal range of motion.  Edema: None  Mental Status: Normal mood and affect. Normal behavior. Normal judgment and thought content.   Assessment and Plan:  Pregnancy: G4P3003 at [redacted]w[redacted]d 1. Encounter for supervision of low-risk pregnancy in third trimester (Primary) BP and FHR normal Doing well, feeling regular movement    2. Previous cesarean section 3. VBAC (vaginal birth after Cesarean) Vbac x 2, has signed tolac consent, will discuss with MD next visit   4. Group B streptococcal bacteriuria Tx in  labor  5. Language barrier affecting health care In person interpreter present  6. [redacted] weeks gestation of pregnancy Anticipatory guidance regarding swabs next visit   Preterm labor symptoms and general obstetric precautions including but not limited to vaginal bleeding, contractions, leaking of fluid and fetal movement were reviewed in detail with the patient. Please refer to After Visit Summary for other counseling recommendations.     Future Appointments  Date Time Provider Department Center  07/28/2024  8:55 AM Nicholaus Burnard HERO, MD [facility] None  08/04/2024  8:55 AM Erik Kieth BROCKS, MD [facility] None    Nidia Daring, FNP

## 2024-07-28 ENCOUNTER — Encounter: Payer: Self-pay | Admitting: Obstetrics and Gynecology

## 2024-07-28 ENCOUNTER — Ambulatory Visit (INDEPENDENT_AMBULATORY_CARE_PROVIDER_SITE_OTHER): Admitting: Obstetrics and Gynecology

## 2024-07-28 ENCOUNTER — Other Ambulatory Visit (HOSPITAL_COMMUNITY)
Admission: RE | Admit: 2024-07-28 | Discharge: 2024-07-28 | Disposition: A | Discharge status: Home / Self Care | Source: Ambulatory Visit | Attending: Obstetrics and Gynecology | Admitting: Obstetrics and Gynecology

## 2024-07-28 VITALS — BP 109/73 | HR 96 | Wt 168.4 lb

## 2024-07-28 DIAGNOSIS — Z3A36 36 weeks gestation of pregnancy: Secondary | ICD-10-CM

## 2024-07-28 DIAGNOSIS — Z603 Acculturation difficulty: Secondary | ICD-10-CM | POA: Diagnosis not present

## 2024-07-28 DIAGNOSIS — R8271 Bacteriuria: Secondary | ICD-10-CM

## 2024-07-28 DIAGNOSIS — Z98891 History of uterine scar from previous surgery: Secondary | ICD-10-CM | POA: Diagnosis not present

## 2024-07-28 DIAGNOSIS — Z113 Encounter for screening for infections with a predominantly sexual mode of transmission: Secondary | ICD-10-CM | POA: Diagnosis present

## 2024-07-28 DIAGNOSIS — O34219 Maternal care for unspecified type scar from previous cesarean delivery: Secondary | ICD-10-CM

## 2024-07-28 DIAGNOSIS — Z758 Other problems related to medical facilities and other health care: Secondary | ICD-10-CM

## 2024-07-28 DIAGNOSIS — D563 Thalassemia minor: Secondary | ICD-10-CM

## 2024-07-28 DIAGNOSIS — Z3493 Encounter for supervision of normal pregnancy, unspecified, third trimester: Secondary | ICD-10-CM

## 2024-07-28 NOTE — Progress Notes (Signed)
   PRENATAL VISIT NOTE  Subjective:  Jillian Browning is a 33 y.o. H5E6996 at [redacted]w[redacted]d being seen today for ongoing prenatal care.  She is currently monitored for the following issues for this high-risk pregnancy and has Alpha thalassemia silent carrier; Language barrier affecting health care; History of tuberculosis; Previous cesarean section; VBAC (vaginal birth after Cesarean); Hemoglobin E trait; Supervision of low-risk pregnancy; and Group B streptococcal bacteriuria on their problem list.  Patient reports usual aches and pains.  Contractions: Not present. Vag. Bleeding: None.  Movement: Present. Denies leaking of fluid.   The following portions of the patient's history were reviewed and updated as appropriate: allergies, current medications, past family history, past medical history, past social history, past surgical history and problem list.   Objective:    Vitals:   07/28/24 0931  BP: 109/73  Pulse: 96  Weight: 168 lb 6.4 oz (76.4 kg)    Fetal Status:  Fetal Heart Rate (bpm): 143 Fundal Height: 36 cm Movement: Present    General: Alert, oriented and cooperative. Patient is in no acute distress.  Skin: Skin is warm and dry. No rash noted.   Cardiovascular: Normal heart rate noted  Respiratory: Normal respiratory effort, no problems with respiration noted  Abdomen: Soft, gravid, appropriate for gestational age.  Cephalic by palpation Pain/Pressure: Present     Pelvic: Cervical exam deferred        Extremities: Normal range of motion.  Edema: None  Mental Status: Normal mood and affect. Normal behavior. Normal judgment and thought content.   Assessment and Plan:  Pregnancy: G4P3003 at [redacted]w[redacted]d 1. [redacted] weeks gestation of pregnancy (Primary) GC/CT today  2. Alpha thalassemia silent carrier  3. Language barrier affecting health care Husband translated  4. Previous cesarean section Planning for TOLAC, previously signed consent  5. VBAC (vaginal birth after Cesarean)  6. Encounter for  supervision of low-risk pregnancy in third trimester  7. Group B streptococcal bacteriuria Ppx in labor  Preterm labor symptoms and general obstetric precautions including but not limited to vaginal bleeding, contractions, leaking of fluid and fetal movement were reviewed in detail with the patient. Please refer to After Visit Summary for other counseling recommendations.   Return in about 1 week (around 08/04/2024) for high OB.  Future Appointments  Date Time Provider Department Center  08/04/2024  8:55 AM Erik Kieth BROCKS, MD [facility] None    Burnard CHRISTELLA Moats, MD

## 2024-07-29 LAB — CERVICOVAGINAL ANCILLARY ONLY
Chlamydia: NEGATIVE
Comment: NEGATIVE
Comment: NORMAL
Neisseria Gonorrhea: NEGATIVE

## 2024-07-30 ENCOUNTER — Ambulatory Visit: Payer: Self-pay | Admitting: Obstetrics and Gynecology

## 2024-08-04 ENCOUNTER — Ambulatory Visit (INDEPENDENT_AMBULATORY_CARE_PROVIDER_SITE_OTHER): Admitting: Obstetrics and Gynecology

## 2024-08-04 VITALS — BP 123/84 | HR 93 | Wt 170.0 lb

## 2024-08-04 DIAGNOSIS — O368130 Decreased fetal movements, third trimester, not applicable or unspecified: Secondary | ICD-10-CM | POA: Diagnosis not present

## 2024-08-04 DIAGNOSIS — Z603 Acculturation difficulty: Secondary | ICD-10-CM

## 2024-08-04 DIAGNOSIS — Z3A37 37 weeks gestation of pregnancy: Secondary | ICD-10-CM

## 2024-08-04 DIAGNOSIS — Z98891 History of uterine scar from previous surgery: Secondary | ICD-10-CM | POA: Diagnosis not present

## 2024-08-04 DIAGNOSIS — R8271 Bacteriuria: Secondary | ICD-10-CM

## 2024-08-04 DIAGNOSIS — D563 Thalassemia minor: Secondary | ICD-10-CM

## 2024-08-04 DIAGNOSIS — Z3493 Encounter for supervision of normal pregnancy, unspecified, third trimester: Secondary | ICD-10-CM | POA: Diagnosis not present

## 2024-08-04 DIAGNOSIS — Z758 Other problems related to medical facilities and other health care: Secondary | ICD-10-CM

## 2024-08-04 DIAGNOSIS — O34219 Maternal care for unspecified type scar from previous cesarean delivery: Secondary | ICD-10-CM

## 2024-08-04 NOTE — Patient Instructions (Signed)
 Carpal Tunnel brace or splint

## 2024-08-04 NOTE — Progress Notes (Signed)
   PRENATAL VISIT NOTE  Subjective:  Jillian Browning is a 33 y.o. H5E6996 at [redacted]w[redacted]d being seen today for ongoing prenatal care.  She is currently monitored for the following issues for this low-risk pregnancy and has Alpha thalassemia silent carrier; Language barrier affecting health care; History of tuberculosis; Previous cesarean section; VBAC (vaginal birth after Cesarean); Hemoglobin E trait; Supervision of low-risk pregnancy; and Group B streptococcal bacteriuria on their problem list.  Patient reports DFM and numbness/tingling in her hands.  Contractions: Irregular. Vag. Bleeding: None.  Movement: Present. Denies leaking of fluid.   The following portions of the patient's history were reviewed and updated as appropriate: allergies, current medications, past family history, past medical history, past social history, past surgical history and problem list.   Objective:   Vitals:   08/04/24 0857  BP: 123/84  Pulse: 93  Weight: 170 lb (77.1 kg)    Fetal Status: Fetal Heart Rate (bpm): 146   Movement: Present     General:  Alert, oriented and cooperative. Patient is in no acute distress.  Skin: Skin is warm and dry. No rash noted.   Cardiovascular: Normal heart rate noted  Respiratory: Normal respiratory effort, no problems with respiration noted  Abdomen: Soft, gravid, appropriate for gestational age.  Pain/Pressure: Absent      Assessment and Plan:  Pregnancy: G4P3003 at [redacted]w[redacted]d 1. Encounter for supervision of low-risk pregnancy in third trimester (Primary) 2. [redacted] weeks gestation of pregnancy DFM reported - NST R&R today. Discussed strict precautions and when to present to MAU for DFM Cephalic by ultrasound today Watch fundal height - 36cm two weeks in a row Carpal tunnel symptoms reviewed & braces recommended  3. Group B streptococcal bacteriuria PCN in labor  4. Previous cesarean section 5. VBAC (vaginal birth after Cesarean) For TOLAC, consent previously signed  6. Language  barrier affecting health care Interviewed/counseled with [place] interpreter  7. Alpha thalassemia silent carrier Declined partner testing/genetics counseling   Please refer to After Visit Summary for other counseling recommendations.   Return in about 1 week (around 08/11/2024) for return OB at 38 weeks.  No future appointments.  Kieth JAYSON Carolin, MD

## 2024-08-04 NOTE — Progress Notes (Signed)
 C/O numbness tingling in both hands for 3 weeks.Wants amniotic fluid checked. States baby with less movement. Placed on monitor 20 min.

## 2024-08-13 ENCOUNTER — Inpatient Hospital Stay (EMERGENCY_DEPARTMENT_HOSPITAL)
Admission: AD | Admit: 2024-08-13 | Discharge: 2024-08-13 | Disposition: A | Discharge status: Home / Self Care | Source: Home / Self Care | Attending: Obstetrics & Gynecology | Admitting: Obstetrics & Gynecology

## 2024-08-13 ENCOUNTER — Inpatient Hospital Stay (HOSPITAL_COMMUNITY)
Admission: AD | Admit: 2024-08-13 | Discharge: 2024-08-14 | DRG: 807 | Disposition: A | Discharge status: Home / Self Care | Attending: Obstetrics and Gynecology | Admitting: Obstetrics and Gynecology

## 2024-08-13 ENCOUNTER — Other Ambulatory Visit: Payer: Self-pay

## 2024-08-13 ENCOUNTER — Encounter (HOSPITAL_COMMUNITY): Payer: Self-pay | Admitting: Obstetrics & Gynecology

## 2024-08-13 ENCOUNTER — Encounter: Admitting: Obstetrics and Gynecology

## 2024-08-13 ENCOUNTER — Encounter: Admitting: Obstetrics

## 2024-08-13 ENCOUNTER — Encounter (HOSPITAL_COMMUNITY): Payer: Self-pay | Admitting: Obstetrics and Gynecology

## 2024-08-13 DIAGNOSIS — O99824 Streptococcus B carrier state complicating childbirth: Secondary | ICD-10-CM | POA: Diagnosis present

## 2024-08-13 DIAGNOSIS — Z8611 Personal history of tuberculosis: Secondary | ICD-10-CM | POA: Diagnosis not present

## 2024-08-13 DIAGNOSIS — O471 False labor at or after 37 completed weeks of gestation: Secondary | ICD-10-CM

## 2024-08-13 DIAGNOSIS — O34219 Maternal care for unspecified type scar from previous cesarean delivery: Secondary | ICD-10-CM | POA: Diagnosis present

## 2024-08-13 DIAGNOSIS — Z148 Genetic carrier of other disease: Secondary | ICD-10-CM | POA: Diagnosis not present

## 2024-08-13 DIAGNOSIS — Z3A38 38 weeks gestation of pregnancy: Secondary | ICD-10-CM | POA: Diagnosis not present

## 2024-08-13 DIAGNOSIS — Z603 Acculturation difficulty: Secondary | ICD-10-CM | POA: Diagnosis present

## 2024-08-13 DIAGNOSIS — B951 Streptococcus, group B, as the cause of diseases classified elsewhere: Secondary | ICD-10-CM | POA: Insufficient documentation

## 2024-08-13 DIAGNOSIS — O26893 Other specified pregnancy related conditions, third trimester: Secondary | ICD-10-CM | POA: Diagnosis present

## 2024-08-13 MED ORDER — LIDOCAINE HCL (PF) 1 % IJ SOLN
30.0000 mL | INTRAMUSCULAR | Status: AC | PRN
  Administered 2024-08-13: 30 mL via SUBCUTANEOUS
  Filled 2024-08-13: qty 30

## 2024-08-13 MED ORDER — SIMETHICONE 80 MG PO CHEW: 80.0000 mg | CHEWABLE_TABLET | ORAL | Status: DC | PRN

## 2024-08-13 MED ORDER — WITCH HAZEL-GLYCERIN EX PADS: 1.0000 | MEDICATED_PAD | CUTANEOUS | Status: DC | PRN

## 2024-08-13 MED ORDER — ONDANSETRON HCL 4 MG PO TABS
4.0000 mg | ORAL_TABLET | ORAL | Status: DC | PRN
Start: 2024-08-13 — End: 2024-08-14

## 2024-08-13 MED ORDER — ONDANSETRON HCL 4 MG/2ML IJ SOLN: 4.0000 mg | INTRAMUSCULAR | Status: DC | PRN

## 2024-08-13 MED ORDER — ZOLPIDEM TARTRATE 5 MG PO TABS: 5.0000 mg | ORAL_TABLET | Freq: Every evening | ORAL | Status: DC | PRN

## 2024-08-13 MED ORDER — OXYCODONE-ACETAMINOPHEN 5-325 MG PO TABS: 2.0000 | ORAL_TABLET | ORAL | Status: DC | PRN

## 2024-08-13 MED ORDER — OXYTOCIN 10 UNIT/ML IJ SOLN: 10.0000 [IU] | Freq: Once | INTRAMUSCULAR | Status: DC

## 2024-08-13 MED ORDER — OXYCODONE-ACETAMINOPHEN 5-325 MG PO TABS: 1.0000 | ORAL_TABLET | ORAL | Status: DC | PRN

## 2024-08-13 MED ORDER — SENNOSIDES-DOCUSATE SODIUM 8.6-50 MG PO TABS: 2.0000 | ORAL_TABLET | Freq: Every day | ORAL | Status: DC

## 2024-08-13 MED ORDER — ACETAMINOPHEN 325 MG PO TABS: 650.0000 mg | ORAL_TABLET | ORAL | Status: DC | PRN

## 2024-08-13 MED ORDER — FLEET ENEMA RE ENEM: 1.0000 | ENEMA | RECTAL | Status: DC | PRN

## 2024-08-13 MED ORDER — OXYTOCIN 10 UNIT/ML IJ SOLN
INTRAMUSCULAR | Status: AC
  Administered 2024-08-13: 10 [IU] via INTRAMUSCULAR
  Filled 2024-08-13: qty 1

## 2024-08-13 MED ORDER — PRENATAL MULTIVITAMIN CH
1.0000 | ORAL_TABLET | Freq: Every day | ORAL | Status: DC
  Administered 2024-08-14: 1 via ORAL
  Filled 2024-08-13: qty 1

## 2024-08-13 MED ORDER — LACTATED RINGERS IV SOLN: INTRAVENOUS | Status: DC

## 2024-08-13 MED ORDER — OXYTOCIN-SODIUM CHLORIDE 30-0.9 UT/500ML-% IV SOLN: 2.5000 [IU]/h | INTRAVENOUS | Status: DC

## 2024-08-13 MED ORDER — DIPHENHYDRAMINE HCL 25 MG PO CAPS: 25.0000 mg | ORAL_CAPSULE | Freq: Four times a day (QID) | ORAL | Status: DC | PRN

## 2024-08-13 MED ORDER — ACETAMINOPHEN 500 MG PO TABS: 1000.0000 mg | ORAL_TABLET | Freq: Four times a day (QID) | ORAL | Status: DC | PRN

## 2024-08-13 MED ORDER — OXYTOCIN BOLUS FROM INFUSION: 333.0000 mL | Freq: Once | INTRAVENOUS | Status: DC

## 2024-08-13 MED ORDER — COCONUT OIL OIL: 1.0000 | TOPICAL_OIL | Status: DC | PRN

## 2024-08-13 MED ORDER — IBUPROFEN 600 MG PO TABS
600.0000 mg | ORAL_TABLET | Freq: Four times a day (QID) | ORAL | Status: DC
  Administered 2024-08-13 – 2024-08-14 (×4): 600 mg via ORAL
  Filled 2024-08-13 (×4): qty 1

## 2024-08-13 MED ORDER — SOD CITRATE-CITRIC ACID 500-334 MG/5ML PO SOLN: 30.0000 mL | ORAL | Status: DC | PRN

## 2024-08-13 MED ORDER — BENZOCAINE-MENTHOL 20-0.5 % EX AERO: 1.0000 | INHALATION_SPRAY | CUTANEOUS | Status: DC | PRN

## 2024-08-13 MED ORDER — DIBUCAINE (PERIANAL) 1 % EX OINT: 1.0000 | TOPICAL_OINTMENT | CUTANEOUS | Status: DC | PRN

## 2024-08-13 MED ORDER — LACTATED RINGERS IV SOLN: 500.0000 mL | INTRAVENOUS | Status: DC | PRN

## 2024-08-13 MED ORDER — ONDANSETRON HCL 4 MG/2ML IJ SOLN: 4.0000 mg | Freq: Four times a day (QID) | INTRAMUSCULAR | Status: DC | PRN

## 2024-08-13 NOTE — Plan of Care (Signed)
Emiliya Chretien, RN 

## 2024-08-13 NOTE — Discharge Summary (Addendum)
 Postpartum Discharge Summary  Date of Service updated***     Patient Name: Jillian Browning DOB: [birth year] MRN: [id number]  Date of admission: 08/13/2024 Delivery date:08/13/2024 Delivering provider: PAYNE, SHANTONETTE M Date of discharge: 08/13/2024  Admitting diagnosis: Indication for care in labor or delivery [O75.9] Intrauterine pregnancy: [redacted]w[redacted]d     Secondary diagnosis:  Active Problems:   SVD (spontaneous vaginal delivery)   Precipitous delivery   Positive GBS test  Additional problems:  Patient Active Problem List   Diagnosis Date Noted   SVD (spontaneous vaginal delivery) 08/13/2024   Precipitous delivery 08/13/2024   Positive GBS test 08/13/2024   Group B streptococcal bacteriuria 01/28/2024   Supervision of low-risk pregnancy 01/15/2024   Hemoglobin E trait 08/02/2020   VBAC (vaginal birth after Cesarean) 07/15/2018   Language barrier affecting health care 02/05/2018   History of tuberculosis 02/05/2018   Previous cesarean section 02/05/2018   Alpha thalassemia silent carrier 01/29/2018       Discharge diagnosis: {DX.:23714}                                              Post partum procedures:{Postpartum procedures:23558} Augmentation: N/A Complications: None  Hospital course: Onset of Labor With Vaginal Delivery      33 y.o. yo [phone] at [redacted]w[redacted]d was admitted in Active Labor on 08/13/2024. Labor course was complicated by precipitous delivery and GBS positive untreated.   Membrane Rupture Time/Date: 11:42 AM,08/13/2024  Delivery Method:Vaginal, Spontaneous Operative Delivery:N/A Episiotomy: None Lacerations:  2nd degree Patient had a postpartum course complicated by ***.  She is ambulating, tolerating a regular diet, passing flatus, and urinating well. Patient is discharged home in stable condition on 08/13/24.  Newborn Data: Birth date:08/13/2024 Birth time:11:42 AM Gender:Female Living status:Living Apgars:9 ,9  Weight:   Magnesium Sulfate received: No BMZ  received: No Rhophylac:N/A MMR:N/A immune  T-DaP:declined  Flu: N/A declined  RSV Vaccine received: {RSV:31013} Transfusion:{Transfusion received:30440034}  Immunizations received: Immunization History  Administered Date(s) Administered   Influenza,inj,Quad PF,6+ Mos 07/19/2020   Tdap 04/01/2018, 07/19/2020    Physical exam  Vitals:   08/13/24 1148 08/13/24 1201  BP: 118/72 124/68  Pulse: 83 83   General: {Exam; general:21111117} Lochia: {Desc; appropriate/inappropriate:30686::appropriate} Uterine Fundus: {Desc; firm/soft:30687} Incision: {Exam; incision:21111123} DVT Evaluation: {Exam; dvt:2111122} Labs: Lab Results  Component Value Date   WBC 14.1 (H) 06/02/2024   HGB 13.2 06/02/2024   HCT 43.4 06/02/2024   MCV 80 06/02/2024   PLT 245 06/02/2024       No data to display         Edinburgh Score:    11/01/2020    9:15 AM  Edinburgh Postnatal Depression Scale Screening Tool  I have been able to laugh and see the funny side of things. 0   I have looked forward with enjoyment to things. 0   I have blamed myself unnecessarily when things went wrong. 0   I have been anxious or worried for no good reason. 0   I have felt scared or panicky for no good reason. 0   Things have been getting on top of me. 0   I have been so unhappy that I have had difficulty sleeping. 0   I have felt sad or miserable. 0   I have been so unhappy that I have been crying. 0   The thought of harming myself  has occurred to me. 0   Edinburgh Postnatal Depression Scale Total 0      Data saved with a previous flowsheet row definition   No data recorded  After visit meds:  Allergies as of 08/13/2024   No Known Allergies   Med Rec must be completed prior to using this [facility]***        Discharge home in stable condition Infant Feeding: {Baby feeding:23562} Infant Disposition:{CHL IP OB HOME WITH FNUYZM:76418} Discharge instruction: per After Visit Summary and Postpartum  booklet. Activity: Advance as tolerated. Pelvic rest for 6 weeks.  Diet: {OB ipzu:78888878} Future Appointments: Future Appointments  Date Time Provider Department Center  08/20/2024 10:15 AM Davis, Devon E, PA-C [facility] None   Follow up Visit:   Please schedule this patient for a In person postpartum visit in 4 weeks with the following provider: Any provider. Additional Postpartum F/U:N/A   High risk pregnancy complicated by: TOLAC  Delivery mode:  Vaginal, Spontaneous Anticipated Birth Control:  None   Message sent on 11/6  08/13/2024 Claris CHRISTELLA Cedar, CNM

## 2024-08-13 NOTE — MAU Provider Note (Signed)
 Jillian Browning is a 33 y.o. G82P3003 female at [redacted]w[redacted]d  RN Labor check, not seen by provider SVE by RN: Dilation: 5 Effacement (%): 80 Station: -1 Exam by:: Amy Sanseverino, RN (initially 4cm, then 5cm x 2 exams; now pt sleeping) NST: FHR baseline 130-140s bpm, Variability: moderate, Accelerations:present, Decelerations:  Absent= Cat 1/Reactive Toco: irregular, every 5-10 minutes  D/C home  Suzen [name] CNM 08/13/2024 7:03 AM

## 2024-08-13 NOTE — H&P (Signed)
 Jillian Browning is a 33 y.o. female presenting for SOL. Patient reports contractions starting at 9pm last night and became more intense at 3am this morning. Hx of C/S in [place] followed by 2 Successful VBACs . Distant Hx TB, CXR clear 2018.GBS urine 4/19 Alpha thal carrier. Declined partner testing/counseling OB History     Gravida  4   Para  3   Term  3   Preterm  0   AB  0   Living  3      SAB  0   IAB  0   Ectopic  0   Multiple  0   Live Births  3          Past Medical History:  Diagnosis Date   Tuberculosis 2016   CXR 2018, no active disease   Past Surgical History:  Procedure Laterality Date   CESAREAN SECTION     Family History: family history includes Healthy in her father and mother. Social History:  reports that she has never smoked. She has never used smokeless tobacco. She reports that she does not drink alcohol and does not use drugs.      NURSING  PROVIDER  Office Location [facility] Dating by Early US  at [redacted]w[redacted]d  [facility] Model Traditional Anatomy U/S Within normal limits   Initiated care at  11wks                 Language  [place]namese               LAB RESULTS   Support Person  [facility] [employer] NIPS: LR female AFP:       NT/IT (FT only)        Carrier Screen [facility]: alpha thal carrier- declined partner testing/counseling  Rhogam  B/Positive/-- (05/06 0929) A1C/GTT Early HgbA1C: 4.7 Third trimester 2 hr GTT: neg  Flu Vaccine No      TDaP Vaccine  declined Blood Type B/Positive/-- (05/06 0929)  RSV Vaccine   Antibody Negative (05/06 0929)  COVID Vaccine No Rubella 31.10 (05/06 0929)  Feeding Plan bottle RPR Non Reactive (05/06 0929)  Contraception natural family planning (NFP) HBsAg Negative (05/06 0929)  Circumcision  No HIV Non Reactive (05/06 0929)  Pediatrician  Velinda armin [facility] HCVAb Non Reactive (05/06 0929)  Prenatal Classes        BTL Consent   Pap       Diagnosis  Date Value Ref Range Status  02/11/2024     Final    - Negative for  intraepithelial lesion or malignancy (NILM)    BTL Pre-payment   GC/CT Initial:   36wks:    VBAC Consent 06/30/24 GBS  Pos urine For PCN allergy, check sensitivities   BRx Optimized? [ ]  yes   [X]  no      DME Rx [X]  BP cuff [ ]  Weight Scale Waterbirth  [ ]  Class [ ]  Consent [ ]  CNM visit  PHQ9 & GAD7 [X]  new OB [  ] 28 weeks  [  ] 36 weeks Induction  [ ]  Orders Entered [ ] Foley Y/N    Review of Systems  Constitutional:  Negative for chills, fatigue and fever.  Eyes:  Negative for pain and visual disturbance.  Respiratory:  Negative for apnea, shortness of breath and wheezing.   Cardiovascular:  Negative for chest pain and palpitations.  Gastrointestinal:  Positive for abdominal pain. Negative for constipation, diarrhea, nausea and vomiting.  Genitourinary:  Positive for vaginal discharge. Negative for difficulty  urinating, dysuria, pelvic pain, vaginal bleeding and vaginal pain.  Musculoskeletal:  Negative for back pain.  Neurological:  Negative for seizures, weakness and headaches.  Psychiatric/Behavioral:  Negative for suicidal ideas.    Maternal Medical History:  Reason for admission: Contractions.  Nausea.  Contractions: Onset was yesterday.   Frequency: regular.   Perceived severity is strong.   Fetal activity: Perceived fetal activity is normal.   Prenatal Complications - Diabetes: none.     Blood pressure 124/68, pulse 83, last menstrual period 10/30/2023. Maternal Exam:  Uterine Assessment: Contraction strength is firm.  Contraction frequency is regular.  Abdomen: Fetal presentation: vertex Cervix: not evaluated.   Fetal Exam Fetal Monitor Review: Baseline rate: 140 * In MAU.  Variability: moderate (6-25 bpm).     Physical Exam Vitals and nursing note reviewed.  Constitutional:      General: She is in acute distress.     Appearance: Normal appearance.  HENT:     Head: Normocephalic.  Pulmonary:     Effort: Pulmonary effort is normal.  Musculoskeletal:      Cervical back: Normal range of motion.  Skin:    General: Skin is warm and dry.  Neurological:     Mental Status: She is alert and oriented to person, place, and time.  Psychiatric:        Mood and Affect: Mood normal.     Prenatal labs: ABO, Rh: B/Positive/-- (05/06 0929) Antibody: Negative (05/06 0929) Rubella: 31.10 (05/06 0929) RPR: Non Reactive (08/26 0831)  HBsAg: Negative (05/06 0929)  HIV: Non Reactive (08/26 0831)  GBS:   Positive in Urine   Assessment/Plan: Jillian Browning , a  32 y.o. H5E5995 at [redacted]w[redacted]d presents for SOL. With hx of C/S for Failure to progress f/b 2 successful VBACs.   Labor: Expectant management  FWB: baseline 140 precipitous delivery I/D: GBS positive > Untreated Circ: Unsure  MOF: Bottle  MOC: none  MOD: SVD   Jillian CHRISTELLA Cedar, MSN CNM  08/13/2024, 12:12 PM

## 2024-08-13 NOTE — MAU Note (Signed)
 MAU Labor Triage Note:  .Jillian Browning is a 33 y.o. at [redacted]w[redacted]d here in MAU reporting:  Contractions every: 7 minutes Onset of ctx: last night around 2100 Pain Score: 7  Pain Location: Abdomen  ROM: denies Vaginal Bleeding: reports bloody show Last SVE: no recent exams Labor Pain Management Plan: Undecided  GBS: Positive  Fetal Movement: Reports positive FM FHT: Fetal Heart Rate Mode: Doppler Baseline Rate (A): 141 bpm  Vitals:   08/13/24 0402  BP: 133/84  Pulse: 93  Resp: 20  Temp: 98.9 F (37.2 C)  SpO2: 97%      Lab orders placed from triage: MAU Labor Eval OB Office: Faculty

## 2024-08-14 ENCOUNTER — Other Ambulatory Visit (HOSPITAL_COMMUNITY): Payer: Self-pay

## 2024-08-14 MED ORDER — IBUPROFEN 600 MG PO TABS
600.0000 mg | ORAL_TABLET | Freq: Four times a day (QID) | ORAL | 0 refills | Status: AC
  Filled 2024-08-14: qty 30, 8d supply, fill #0

## 2024-08-14 NOTE — Progress Notes (Addendum)
 POSTPARTUM PROGRESS NOTE  Subjective: Jillian Browning is a 33 y.o. H5E5995 s/p SVB at [redacted]w[redacted]d.  She reports she doing well. No acute events overnight. She denies any problems with ambulating, voiding or po intake. Denies nausea or vomiting. She has  passed flatus. Pain is well controlled.  Lochia is appropriate light.  Objective: Blood pressure 114/72, pulse 68, temperature 98 F (36.7 C), temperature source Oral, resp. rate 17, last menstrual period 10/30/2023, SpO2 100%, unknown if currently breastfeeding.  Physical Exam:  General: alert, cooperative and no distress Chest: no respiratory distress Abdomen: soft, non-tender  Uterine Fundus: firm, appropriately tender Extremities: No calf swelling or tenderness  trace edema  No results for input(s): HGB, HCT in the last 72 hours.  Assessment/Plan: Jillian Browning is a 33 y.o. H5E5995 s/p SVB at [redacted]w[redacted]d  Routine Postpartum Care: Doing well, pain well-controlled.  -- Continue routine care, lactation support  -- Contraception: none -- Feeding: bottle - Remote Vietnamese interpreter used for entire episode of care.   Dispo: Plan for discharge tomorrow 08/15/24.  Camie Rote, MSN, CNM, RNC-OB Certified Nurse Midwife, [facility] 08/14/2024 12:20 AM

## 2024-08-14 NOTE — Patient Instructions (Signed)

## 2024-08-20 ENCOUNTER — Encounter: Admitting: Physician Assistant

## 2024-08-28 ENCOUNTER — Telehealth (HOSPITAL_COMMUNITY): Payer: Self-pay | Admitting: *Deleted

## 2024-08-28 NOTE — Telephone Encounter (Signed)
 08/28/2024  Name: Jillian Browning MRN: [id number] DOB: Jul 31, 1991  Reason for Call:  Transition of Care Hospital Discharge Call  Contact Status: Patient Contact Status: Message  Language assistant needed: Interpreter Mode: Telephonic Interpreter Interpreter Name: Bernestine [phone]        Follow-Up Questions:    Van Postnatal Depression Scale:  In the Past 7 Days:    PHQ2-9 Depression Scale:     Discharge Follow-up:    Post-discharge interventions: NA  Mliss Sieve, RN 08/28/2024 13:45

## 2024-09-10 ENCOUNTER — Encounter: Payer: Self-pay | Admitting: Obstetrics and Gynecology

## 2024-09-10 ENCOUNTER — Ambulatory Visit: Admitting: Obstetrics and Gynecology

## 2024-09-10 NOTE — Progress Notes (Signed)
 Post Partum Visit Note  Jillian Browning is a 33 y.o. G37P4004 female who presents for a postpartum visit. She is 4 weeks postpartum following a normal spontaneous vaginal delivery.  I have fully reviewed the prenatal and intrapartum course. The delivery was at [redacted]w[redacted]d gestational weeks.  Anesthesia: none. Postpartum course has been good. Baby is doing well. Baby is feeding by bottle - Similac Alimentum. Bleeding no bleeding. Bowel function is normal. Bladder function is normal. Patient is not sexually active. Contraception method is none. Postpartum depression screening: negative.   The pregnancy intention screening data noted above was reviewed. Potential methods of contraception were discussed. The patient elected to proceed with No data recorded.   Edinburgh Postnatal Depression Scale - 09/10/24 1536       Edinburgh Postnatal Depression Scale:  In the Past 7 Days   I have been able to laugh and see the funny side of things. 0    I have looked forward with enjoyment to things. 0    I have blamed myself unnecessarily when things went wrong. 0    I have been anxious or worried for no good reason. 0    I have felt scared or panicky for no good reason. 0    Things have been getting on top of me. 0    I have been so unhappy that I have had difficulty sleeping. 0    I have felt sad or miserable. 0    I have been so unhappy that I have been crying. 0    The thought of harming myself has occurred to me. 0    Edinburgh Postnatal Depression Scale Total 0          Health Maintenance Due  Topic Date Due   Hepatitis B Vaccines 19-59 Average Risk (1 of 3 - 19+ 3-dose series) Never done   HPV VACCINES (1 - 3-dose SCDM series) Never done   Influenza Vaccine  05/08/2024   COVID-19 Vaccine (1 - 2025-26 season) Never done    The following portions of the patient's history were reviewed and updated as appropriate: allergies, current medications, past family history, past medical history, past social  history, past surgical history, and problem list.  Review of Systems A comprehensive review of systems was negative.  Objective:  BP 118/73   Pulse 75   Ht 5' 3 (1.6 m)   Wt 151 lb (68.5 kg)   LMP 10/30/2023   Breastfeeding No   BMI 26.75 kg/m    General:  alert, cooperative, and no distress   Breasts:  Not examined  Lungs: Normal effort  Heart:  regular rate and rhythm, S1, S2 normal, no murmur, click, rub or gallop  Abdomen: Not examined   Wound N/a  GU exam:  normal       Assessment:    1. Encounter for postpartum visit (Primary) Normal postpartum exam.   Plan:   Essential components of care per ACOG recommendations:  1.  Mood and well being: Patient with negative depression screening today. Reviewed local resources for support.  - Patient tobacco use? No.   - hx of drug use? No.    2. Infant care and feeding:  -Patient currently breastmilk feeding? No.  -Social determinants of health (SDOH) reviewed in EPIC. No concerns   3. Sexuality, contraception and birth spacing - Patient does not want a pregnancy in the next year.  Desired family size is 4 children.  - Reviewed reproductive life planning. Reviewed contraceptive methods  based on pt preferences and effectiveness.  Patient desired Unknown/Not Reported today.   - Discussed birth spacing of 18 months  4. Sleep and fatigue -Encouraged family/partner/community support of 4 hrs of uninterrupted sleep to help with mood and fatigue  5. Physical Recovery  - Discussed patients delivery and complications. She describes her labor as good. - Patient had a VBAC. Patient had a 2nd degree laceration. Perineal healing reviewed. Patient expressed understanding - Patient has urinary incontinence? No. - Patient is safe to resume physical and sexual activity  6.  Health Maintenance - HM due items addressed Yes - Last pap smear  Diagnosis  Date Value Ref Range Status  02/11/2024   Final   - Negative for intraepithelial  lesion or malignancy (NILM)   Pap smear not done at today's visit.  -Breast Cancer screening indicated? No.   7. Chronic Disease/Pregnancy Condition follow up: None  - PCP follow up  Ala Cart, CNM [facility] [employer], [facility]

## 2024-09-20 ENCOUNTER — Encounter: Payer: Self-pay | Admitting: Obstetrics and Gynecology
